# Patient Record
Sex: Male | Born: 1975 | Race: Black or African American | Hispanic: No | Marital: Single | Smoking: Current every day smoker
Health system: Southern US, Community
[De-identification: ages and names within clinical notes are randomized; demographics above are authoritative.]

## PROBLEM LIST (undated history)

## (undated) DIAGNOSIS — K8020 Calculus of gallbladder without cholecystitis without obstruction: Secondary | ICD-10-CM

---

## 1999-03-26 ENCOUNTER — Encounter: Payer: Self-pay | Admitting: Emergency Medicine

## 1999-03-26 ENCOUNTER — Emergency Department (HOSPITAL_COMMUNITY): Admission: EM | Admit: 1999-03-26 | Discharge: 1999-03-26 | Payer: Self-pay | Admitting: Emergency Medicine

## 1999-04-01 ENCOUNTER — Emergency Department (HOSPITAL_COMMUNITY): Admission: EM | Admit: 1999-04-01 | Discharge: 1999-04-01 | Payer: Self-pay | Admitting: Emergency Medicine

## 2005-01-26 ENCOUNTER — Emergency Department (HOSPITAL_COMMUNITY): Admission: EM | Admit: 2005-01-26 | Discharge: 2005-01-26 | Payer: Self-pay | Admitting: Emergency Medicine

## 2006-07-02 IMAGING — CR DG CHEST 1V PORT
1 series · 1 of 1 positions shown · non-contrast
Comparison: None.

CLINICAL DATA: Chest pain.

[view not recorded]
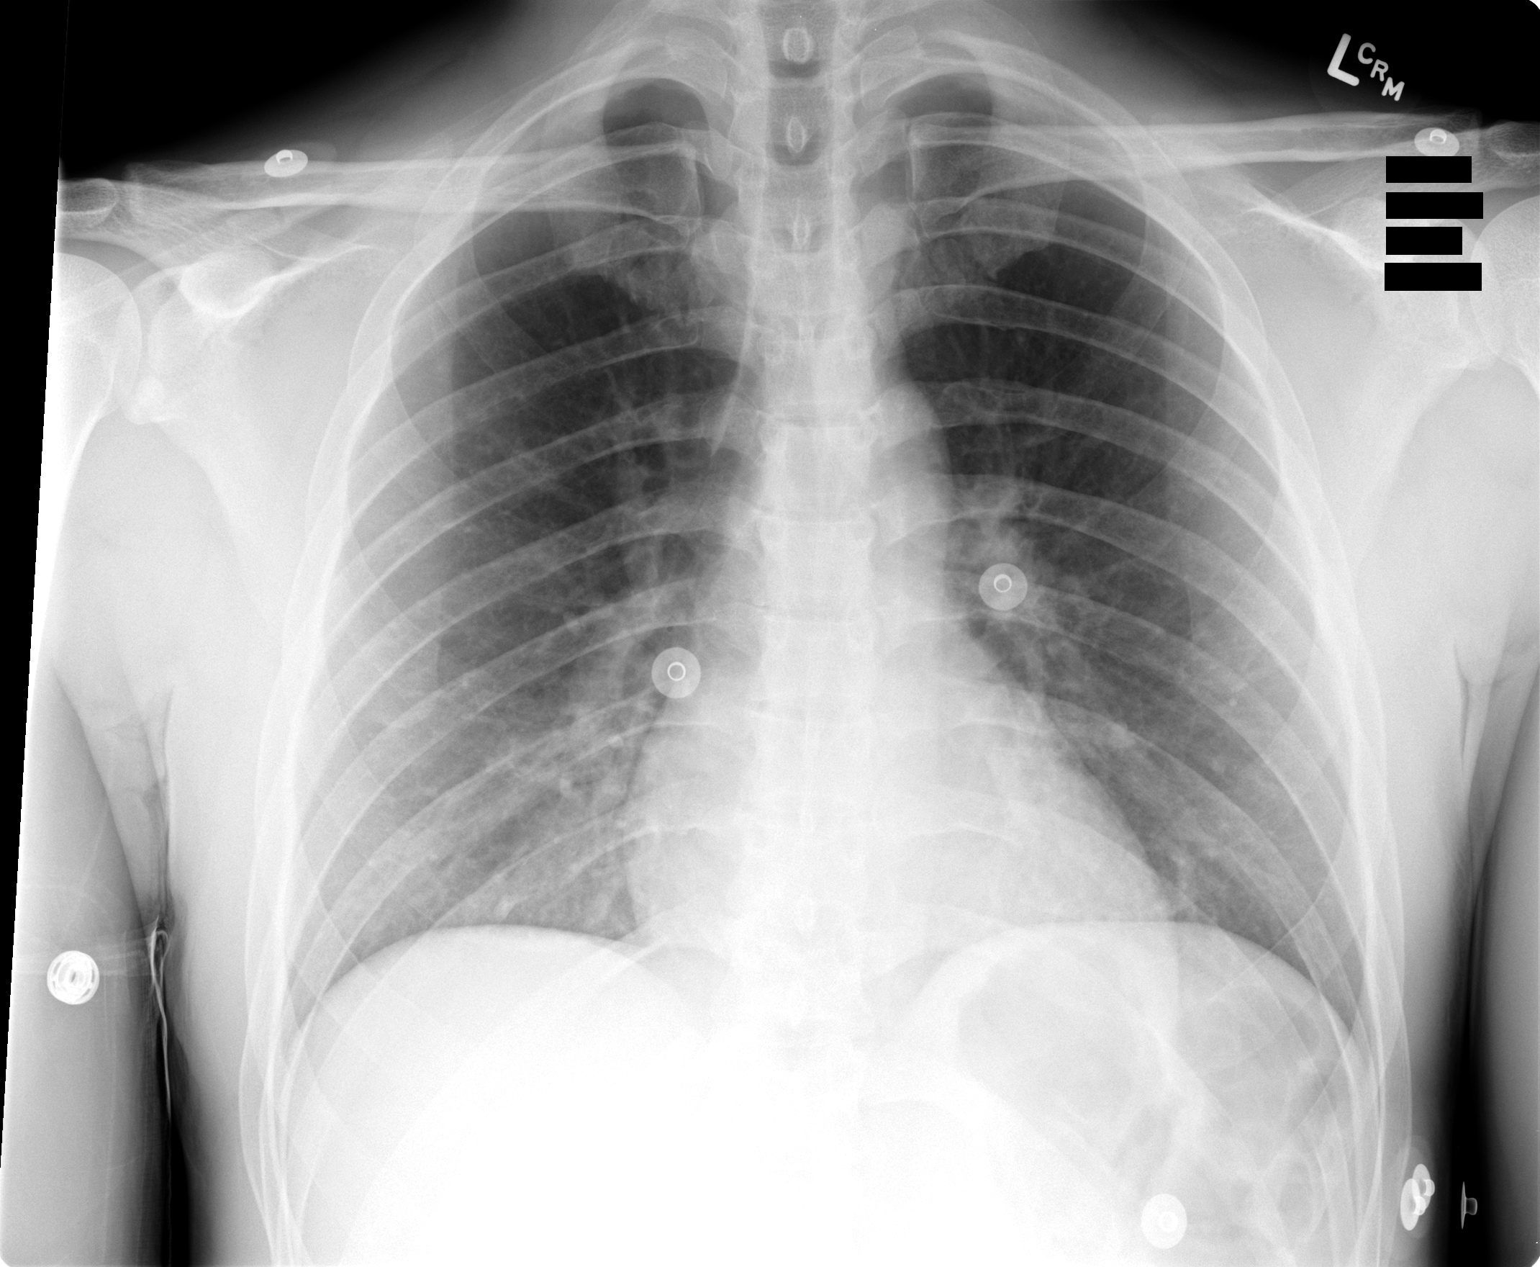

[1 of 1 positions shown; findings below may reference images not displayed]

PORTABLE CHEST - 1 VIEW:
 The heart size and mediastinal contours are within normal limits.  Both lungs are clear.
IMPRESSION: No acute disease.

## 2012-11-03 DIAGNOSIS — Z79899 Other long term (current) drug therapy: Secondary | ICD-10-CM | POA: Insufficient documentation

## 2012-11-03 DIAGNOSIS — K2970 Gastritis, unspecified, without bleeding: Secondary | ICD-10-CM | POA: Insufficient documentation

## 2012-11-03 DIAGNOSIS — R110 Nausea: Secondary | ICD-10-CM | POA: Insufficient documentation

## 2012-11-03 LAB — URINALYSIS, ROUTINE W REFLEX MICROSCOPIC
Bilirubin Urine: NEGATIVE
Glucose, UA: NEGATIVE mg/dL
Hgb urine dipstick: NEGATIVE
Ketones, ur: NEGATIVE mg/dL
Leukocytes, UA: NEGATIVE
Nitrite: NEGATIVE
Protein, ur: NEGATIVE mg/dL
Specific Gravity, Urine: 1.02 (ref 1.005–1.030)
Urobilinogen, UA: 0.2 mg/dL (ref 0.0–1.0)
pH: 6.5 (ref 5.0–8.0)

## 2012-11-03 MED ORDER — ONDANSETRON 4 MG PO TBDP
4.0000 mg | ORAL_TABLET | Freq: Once | ORAL | Status: AC
  Administered 2012-11-03: 4 mg via ORAL
  Filled 2012-11-03: qty 1

## 2012-11-03 NOTE — ED Notes (Signed)
Pt from [facility] arrived via [place] c/o abdominal pain after eating pasta. Pt ambulatory without assistance  EMS VS 142/88, HR 76, Pain 6 on 0 - 10 pain rating scale. No medical Hx, No medications

## 2012-11-04 ENCOUNTER — Encounter (HOSPITAL_COMMUNITY): Payer: Self-pay | Admitting: *Deleted

## 2012-11-04 ENCOUNTER — Emergency Department (HOSPITAL_COMMUNITY)
Admission: EM | Admit: 2012-11-04 | Discharge: 2012-11-04 | Disposition: A | Discharge status: Home / Self Care | Payer: Self-pay | Attending: Emergency Medicine | Admitting: Emergency Medicine

## 2012-11-04 DIAGNOSIS — K2970 Gastritis, unspecified, without bleeding: Secondary | ICD-10-CM

## 2012-11-04 LAB — COMPREHENSIVE METABOLIC PANEL
ALT: 30 U/L (ref 0–53)
AST: 20 U/L (ref 0–37)
Albumin: 4.4 g/dL (ref 3.5–5.2)
Alkaline Phosphatase: 39 U/L (ref 39–117)
BUN: 14 mg/dL (ref 6–23)
CO2: 26 mEq/L (ref 19–32)
Calcium: 9.8 mg/dL (ref 8.4–10.5)
Chloride: 103 mEq/L (ref 96–112)
Creatinine, Ser: 1.08 mg/dL (ref 0.50–1.35)
GFR calc Af Amer: >90 mL/min (ref >90)
GFR calc non Af Amer: 87 mL/min — ABNORMAL LOW (ref >90)
Glucose, Bld: 133 mg/dL — ABNORMAL HIGH (ref 70–99)
Potassium: 4 mEq/L (ref 3.5–5.1)
Sodium: 142 mEq/L (ref 135–145)
Total Bilirubin: 0.4 mg/dL (ref 0.3–1.2)
Total Protein: 7.4 g/dL (ref 6.0–8.3)

## 2012-11-04 LAB — CBC WITH DIFFERENTIAL/PLATELET
Basophils Absolute: 0 10*3/uL (ref 0.0–0.1)
Basophils Relative: 0 % (ref 0–1)
Eosinophils Absolute: 0.1 10*3/uL (ref 0.0–0.7)
Eosinophils Relative: 1 % (ref 0–5)
HCT: 44.7 % (ref 39.0–52.0)
Hemoglobin: 15.5 g/dL (ref 13.0–17.0)
Lymphocytes Relative: 22 % (ref 12–46)
Lymphs Abs: 2.9 10*3/uL (ref 0.7–4.0)
MCH: 31.9 pg (ref 26.0–34.0)
MCHC: 34.7 g/dL (ref 30.0–36.0)
MCV: 92 fL (ref 78.0–100.0)
Monocytes Absolute: 0.6 10*3/uL (ref 0.1–1.0)
Monocytes Relative: 5 % (ref 3–12)
Neutro Abs: 9.5 10*3/uL — ABNORMAL HIGH (ref 1.7–7.7)
Neutrophils Relative %: 73 % (ref 43–77)
Platelets: 255 10*3/uL (ref 150–400)
RBC: 4.86 MIL/uL (ref 4.22–5.81)
RDW: 13.6 % (ref 11.5–15.5)
WBC: 13.1 10*3/uL — ABNORMAL HIGH (ref 4.0–10.5)

## 2012-11-04 LAB — LIPASE, BLOOD: Lipase: 28 U/L (ref 11–59)

## 2012-11-04 MED ORDER — PROMETHAZINE HCL 25 MG PO TABS: 25.0000 mg | ORAL_TABLET | Freq: Four times a day (QID) | ORAL | Status: DC | PRN

## 2012-11-04 MED ORDER — RANITIDINE HCL 150 MG PO CAPS: 150.0000 mg | ORAL_CAPSULE | Freq: Every day | ORAL | Status: DC

## 2012-11-04 MED ORDER — GI COCKTAIL ~~LOC~~
30.0000 mL | Freq: Once | ORAL | Status: AC
  Administered 2012-11-04: 30 mL via ORAL
  Filled 2012-11-04: qty 30

## 2012-11-04 NOTE — ED Provider Notes (Signed)
History     CSN: [id number]  Arrival date & time 11/03/12  2255   First MD Initiated Contact with Patient 11/04/12 [phone]      Chief Complaint  Patient presents with  . Abdominal Pain    (Consider location/radiation/quality/duration/timing/severity/associated sxs/prior treatment) HPI History provided by pt.   Pt has had constant, severe epigastric pain w/ radiation around right side to back since eating dinner (spaghetti) last night.  Non-positional and non-exertional.  Associated w/ nausea.  Denies fever, CP, SOB, diarrhea, hematochezia/melena, and urinary sx.  Drinks alcohol ~2x/wk, most recently yesterday, and usually has 2 40oz beers.  No h/o GERD or gastric ulcers or other PMH.  No h/o abd surgeries.  History reviewed. No pertinent past medical history.  History reviewed. No pertinent past surgical history.  No family history on file.  History  Substance Use Topics  . Smoking status: Never Smoker   . Smokeless tobacco: Not on file  . Alcohol Use: Yes      Review of Systems  All other systems reviewed and are negative.    Allergies  Review of patient's allergies indicates no known allergies.  Home Medications   Current Outpatient Rx  Name  Route  Sig  Dispense  Refill  . promethazine (PHENERGAN) 25 MG tablet   Oral   Take 1 tablet (25 mg total) by mouth every 6 (six) hours as needed for nausea.   20 tablet   0   . ranitidine (ZANTAC) 150 MG capsule   Oral   Take 1 capsule (150 mg total) by mouth daily.   30 capsule   0     BP 127/67  Pulse 66  Temp(Src) 97.9 F (36.6 C) (Oral)  Resp 18  SpO2 99%  Physical Exam  Nursing note and vitals reviewed. Constitutional: He is oriented to person, place, and time. He appears well-developed and well-nourished. No distress.  HENT:  Head: Normocephalic and atraumatic.  Eyes:  Normal appearance  Neck: Normal range of motion.  Cardiovascular: Normal rate, regular rhythm and intact distal pulses.    Pulmonary/Chest: Effort normal and breath sounds normal. No respiratory distress. He exhibits no tenderness.  No pleuritic pain reported  Abdominal: Soft. Bowel sounds are normal. He exhibits no distension. There is no guarding.  Pt guards by flexing abdominal muscles w/ palpation of RUQ but reports that it is non-painful.    Musculoskeletal: Normal range of motion.  No peripheral edema or calf tenderness  Neurological: He is alert and oriented to person, place, and time.  Skin: Skin is warm and dry. No rash noted.  Psychiatric: He has a normal mood and affect. His behavior is normal.    ED Course  Procedures (including critical care time)   Date: 11/04/2012  Rate: 68  Rhythm: normal sinus rhythm  QRS Axis: normal  Intervals: normal  ST/T Wave abnormalities: nonspecific ST changes  Conduction Disutrbances:none  Narrative Interpretation:   Old EKG Reviewed: none available   Labs Reviewed  CBC WITH DIFFERENTIAL - Abnormal; Notable for the following:    WBC 13.1 (*)    Neutro Abs 9.5 (*)    All other components within normal limits  COMPREHENSIVE METABOLIC PANEL - Abnormal; Notable for the following:    Glucose, Bld 133 (*)    GFR calc non Af Amer 87 (*)    All other components within normal limits  LIPASE, BLOOD  URINALYSIS, ROUTINE W REFLEX MICROSCOPIC  POCT I-STAT TROPONIN I   No results found.  1. Gastritis       MDM  36yo healthy M who drinks 2 40oz beers 2x/wk, presents w/ epigastric pain that started after his spaghetti dinner last night.  Associated w/ nausea.  No prior h/o same.  Pain minimal currently.  On exam, afebrile, NAD, abd soft/non-distended and reportedly non-tender.  Labs, including troponin obtained in triage and sig for mild leukocytosis.  EKG shows non-specific ST changes, though patient's sx are not consistent w/ pericarditis.  No prior for comparison.  Suspect gastritis.  Unlikely to be cardiac based on atypical characteristics and lack of RF.   Has not had PP pain/nausea previously to suggest cholelithiasis.  Received a GI cocktail and pain resolved.   D/c'd home w/ promethazine and ranitidine ($4 list) and referred to [facility].  Return precautions discussed.         Otilio Miu, PA-C 11/04/12 [phone]

## 2012-11-04 NOTE — ED Provider Notes (Signed)
Medical screening examination/treatment/procedure(s) were performed by non-physician practitioner and as supervising physician I was immediately available for consultation/collaboration.  Saisha Hogue K Letoya Stallone-Rasch, MD 11/04/12 0627 

## 2012-11-04 NOTE — ED Notes (Signed)
In last 24 hrs. Pt. Ate a lot of spicy foods; and had marijuana yesterday; eating all day.

## 2012-11-24 ENCOUNTER — Encounter (HOSPITAL_COMMUNITY): Payer: Self-pay | Admitting: Emergency Medicine

## 2012-11-24 ENCOUNTER — Emergency Department (HOSPITAL_COMMUNITY): Payer: Self-pay

## 2012-11-24 ENCOUNTER — Emergency Department (HOSPITAL_COMMUNITY)
Admission: EM | Admit: 2012-11-24 | Discharge: 2012-11-24 | Disposition: A | Discharge status: Home / Self Care | Payer: Self-pay | Attending: Emergency Medicine | Admitting: Emergency Medicine

## 2012-11-24 DIAGNOSIS — R110 Nausea: Secondary | ICD-10-CM | POA: Insufficient documentation

## 2012-11-24 DIAGNOSIS — K8050 Calculus of bile duct without cholangitis or cholecystitis without obstruction: Secondary | ICD-10-CM

## 2012-11-24 DIAGNOSIS — Z79899 Other long term (current) drug therapy: Secondary | ICD-10-CM | POA: Insufficient documentation

## 2012-11-24 DIAGNOSIS — K8020 Calculus of gallbladder without cholecystitis without obstruction: Secondary | ICD-10-CM | POA: Insufficient documentation

## 2012-11-24 LAB — CBC WITH DIFFERENTIAL/PLATELET
Basophils Absolute: 0 10*3/uL (ref 0.0–0.1)
Basophils Relative: 0 % (ref 0–1)
Eosinophils Absolute: 0 10*3/uL (ref 0.0–0.7)
Eosinophils Relative: 1 % (ref 0–5)
HCT: 45.2 % (ref 39.0–52.0)
Hemoglobin: 16 g/dL (ref 13.0–17.0)
Lymphocytes Relative: 16 % (ref 12–46)
Lymphs Abs: 1.4 10*3/uL (ref 0.7–4.0)
MCH: 32.4 pg (ref 26.0–34.0)
MCHC: 35.4 g/dL (ref 30.0–36.0)
MCV: 91.5 fL (ref 78.0–100.0)
Monocytes Absolute: 0.5 10*3/uL (ref 0.1–1.0)
Monocytes Relative: 5 % (ref 3–12)
Neutro Abs: 6.8 10*3/uL (ref 1.7–7.7)
Neutrophils Relative %: 78 % — ABNORMAL HIGH (ref 43–77)
Platelets: 274 10*3/uL (ref 150–400)
RBC: 4.94 MIL/uL (ref 4.22–5.81)
RDW: 13.3 % (ref 11.5–15.5)
WBC: 8.7 10*3/uL (ref 4.0–10.5)

## 2012-11-24 LAB — COMPREHENSIVE METABOLIC PANEL
ALT: 32 U/L (ref 0–53)
AST: 24 U/L (ref 0–37)
Albumin: 4.1 g/dL (ref 3.5–5.2)
Alkaline Phosphatase: 46 U/L (ref 39–117)
BUN: 16 mg/dL (ref 6–23)
CO2: 24 mEq/L (ref 19–32)
Calcium: 9.4 mg/dL (ref 8.4–10.5)
Chloride: 98 mEq/L (ref 96–112)
Creatinine, Ser: 1.04 mg/dL (ref 0.50–1.35)
GFR calc Af Amer: >90 mL/min (ref >90)
GFR calc non Af Amer: >90 mL/min (ref >90)
Glucose, Bld: 120 mg/dL — ABNORMAL HIGH (ref 70–99)
Potassium: 3.3 mEq/L — ABNORMAL LOW (ref 3.5–5.1)
Sodium: 135 mEq/L (ref 135–145)
Total Bilirubin: 0.6 mg/dL (ref 0.3–1.2)
Total Protein: 7.3 g/dL (ref 6.0–8.3)

## 2012-11-24 LAB — LIPASE, BLOOD: Lipase: 31 U/L (ref 11–59)

## 2012-11-24 MED ORDER — OXYCODONE-ACETAMINOPHEN 5-325 MG PO TABS: 1.0000 | ORAL_TABLET | ORAL | Status: DC | PRN

## 2012-11-24 MED ORDER — PANTOPRAZOLE SODIUM 40 MG IV SOLR
40.0000 mg | Freq: Once | INTRAVENOUS | Status: AC
  Administered 2012-11-24: 40 mg via INTRAVENOUS
  Filled 2012-11-24: qty 40

## 2012-11-24 NOTE — ED Notes (Signed)
Followed up on lab work. Labs are in progress. Dr. Bebe Shaggy made aware.Pt made aware. Will continue to monitor.

## 2012-11-24 NOTE — ED Provider Notes (Signed)
History     CSN: [id number]  Arrival date & time 11/24/12  7829   First MD Initiated Contact with Patient 11/24/12 0240      Chief Complaint - abdominal pain   Patient is a 37 y.o. male presenting with abdominal pain. The history is provided by the patient.  Abdominal Pain This is a recurrent problem. The current episode started 6 to 12 hours ago. The problem occurs constantly. The problem has been gradually improving. Associated symptoms include abdominal pain. Pertinent negatives include no chest pain and no shortness of breath. The symptoms are aggravated by eating. The symptoms are relieved by rest. He has tried rest (home pain meds) for the symptoms. The treatment provided mild relief.  pt reports soon after eating he developed pain in RUQ.   No cp/sob No back pain No dysuria No fever is reported He reports similar episode last month   PMH - none Soc hx - admits to ETOH use  No past surgical history on file.  No family history on file.  History  Substance Use Topics  . Smoking status: Never Smoker   . Smokeless tobacco: Not on file  . Alcohol Use: Yes      Review of Systems  Constitutional: Negative for fever.  Respiratory: Negative for shortness of breath.   Cardiovascular: Negative for chest pain.  Gastrointestinal: Positive for nausea and abdominal pain.  Genitourinary: Negative for dysuria.  Musculoskeletal: Negative for back pain.  Neurological: Negative for weakness.  All other systems reviewed and are negative.    Allergies  Review of patient's allergies indicates no known allergies.  Home Medications   Current Outpatient Rx  Name  Route  Sig  Dispense  Refill  . promethazine (PHENERGAN) 25 MG tablet   Oral   Take 1 tablet (25 mg total) by mouth every 6 (six) hours as needed for nausea.   20 tablet   0   . ranitidine (ZANTAC) 150 MG capsule   Oral   Take 1 capsule (150 mg total) by mouth daily.   30 capsule   0     BP 127/87  Pulse 62   Temp(Src) 98.4 F (36.9 C) (Oral)  Ht 5\' 7"  (1.702 m)  Wt 180 lb (81.647 kg)  BMI 28.19 kg/m2  SpO2 97%  Physical Exam CONSTITUTIONAL: Well developed/well nourished HEAD: Normocephalic/atraumatic EYES: EOMI/PERRL, no icterus ENMT: Mucous membranes moist NECK: supple no meningeal signs SPINE:entire spine nontender CV: S1/S2 noted, no murmurs/rubs/gallops noted LUNGS: Lungs are clear to auscultation bilaterally, no apparent distress ABDOMEN: soft, RUQ tenderness is noted, no rebound or guarding GU:no cva tenderness NEURO: Pt is awake/alert, moves all extremitiesx4 EXTREMITIES: pulses normal, full ROM SKIN: warm, color normal PSYCH: no abnormalities of mood noted  ED Course  Procedures  3:16 AM Pt here for recurrent RUQ abdominal pain that occurs after eating He has pain, but is improving He requests imaging - will order RUQ ultrasound He had labs back in may that were unremarkable.  Will defer labs for now unless imaging is positive He is in no distress 6:17 AM Labs ordered when cholelithiasis noted Labs reassuring Advised pt of his diagnosis and need for outpatient followup He appears well, sleeping in the room at this time Strict return precautions discussed with patient  MDM  Nursing notes including past medical history and social history reviewed and considered in documentation Previous records reviewed and considered - previous labs reviewed from ED visit on -05/17 Labs/vital reviewed and considered Ultrasound reviewed and  considered        Date: 11/24/2012  Rate: 71  Rhythm: normal sinus rhythm  QRS Axis: normal  Intervals: normal  ST/T Wave abnormalities: early repolarization  Conduction Disutrbances:none  Narrative Interpretation:   Old EKG Reviewed: unchanged from prior    Joya Gaskins, MD 11/24/12 [phone]

## 2012-11-25 ENCOUNTER — Emergency Department (HOSPITAL_COMMUNITY)
Admission: EM | Admit: 2012-11-25 | Discharge: 2012-11-25 | Disposition: A | Discharge status: Home / Self Care | Payer: Self-pay | Attending: Emergency Medicine | Admitting: Emergency Medicine

## 2012-11-25 ENCOUNTER — Encounter (HOSPITAL_COMMUNITY): Payer: Self-pay | Admitting: *Deleted

## 2012-11-25 DIAGNOSIS — K8050 Calculus of bile duct without cholangitis or cholecystitis without obstruction: Secondary | ICD-10-CM

## 2012-11-25 DIAGNOSIS — K8020 Calculus of gallbladder without cholecystitis without obstruction: Secondary | ICD-10-CM | POA: Insufficient documentation

## 2012-11-25 LAB — COMPREHENSIVE METABOLIC PANEL
Alkaline Phosphatase: 43 U/L (ref 39–117)
BUN: 12 mg/dL (ref 6–23)
CO2: 25 mEq/L (ref 19–32)
GFR calc Af Amer: >90 mL/min (ref >90)
GFR calc non Af Amer: >90 mL/min (ref >90)
Glucose, Bld: 105 mg/dL — ABNORMAL HIGH (ref 70–99)
Potassium: 3.7 mEq/L (ref 3.5–5.1)
Total Protein: 6.8 g/dL (ref 6.0–8.3)

## 2012-11-25 LAB — CBC WITH DIFFERENTIAL/PLATELET
Eosinophils Absolute: 0.1 10*3/uL (ref 0.0–0.7)
Eosinophils Relative: 2 % (ref 0–5)
Hemoglobin: 15 g/dL (ref 13.0–17.0)
Lymphocytes Relative: 24 % (ref 12–46)
Lymphs Abs: 1.8 10*3/uL (ref 0.7–4.0)
MCH: 32.3 pg (ref 26.0–34.0)
MCV: 92.3 fL (ref 78.0–100.0)
Monocytes Relative: 8 % (ref 3–12)
Neutrophils Relative %: 67 % (ref 43–77)
RBC: 4.65 MIL/uL (ref 4.22–5.81)

## 2012-11-25 LAB — LIPASE, BLOOD: Lipase: 35 U/L (ref 11–59)

## 2012-11-25 MED ORDER — HYDROMORPHONE HCL PF 2 MG/ML IJ SOLN
2.0000 mg | Freq: Once | INTRAMUSCULAR | Status: AC
  Administered 2012-11-25: 2 mg via INTRAMUSCULAR
  Filled 2012-11-25: qty 1

## 2012-11-25 MED ORDER — HYDROCODONE-ACETAMINOPHEN 5-325 MG PO TABS: 2.0000 | ORAL_TABLET | ORAL | Status: DC | PRN

## 2012-11-25 NOTE — ED Provider Notes (Signed)
History     CSN: [id number]  Arrival date & time 11/25/12  0128   First MD Initiated Contact with Patient 11/25/12 0204      No chief complaint on file.   (Consider location/radiation/quality/duration/timing/severity/associated sxs/prior treatment) HPI Comments: Patient presents with complaints of right upper quadrant pain that started up again at 10PM.  He was seen for yesterday and had an ultrasound that showed cholelithiasis without evidence for cholecystitis.  He was feeling better and discharged.  His pain returned tonight.  No vomiting or diarrhea.  No fevers.  Patient is a 37 y.o. male presenting with abdominal pain. The history is provided by the patient.  Abdominal Pain This is a new problem. Episode onset: 10 PM. The problem occurs constantly. The problem has not changed since onset.Associated symptoms include abdominal pain. Nothing aggravates the symptoms. Nothing relieves the symptoms. He has tried nothing for the symptoms. The treatment provided no relief.    No past medical history on file.  No past surgical history on file.  No family history on file.  History  Substance Use Topics  . Smoking status: Never Smoker   . Smokeless tobacco: Not on file  . Alcohol Use: Yes      Review of Systems  Gastrointestinal: Positive for abdominal pain.  All other systems reviewed and are negative.    Allergies  Review of patient's allergies indicates no known allergies.  Home Medications   Current Outpatient Rx  Name  Route  Sig  Dispense  Refill  . oxyCODONE-acetaminophen (PERCOCET/ROXICET) 5-325 MG per tablet   Oral   Take 1 tablet by mouth every 4 (four) hours as needed for pain.   6 tablet   0     BP 146/91  Pulse 67  Temp(Src) 98.6 F (37 C) (Oral)  Resp 16  SpO2 96%  Physical Exam  Nursing note and vitals reviewed. Constitutional: He is oriented to person, place, and time. He appears well-developed and well-nourished. No distress.  HENT:  Head:  Normocephalic and atraumatic.  Mouth/Throat: Oropharynx is clear and moist.  Neck: Normal range of motion. Neck supple.  Cardiovascular: Normal rate and regular rhythm.   No murmur heard. Pulmonary/Chest: Effort normal and breath sounds normal. No respiratory distress.  Abdominal: Soft. Bowel sounds are normal. He exhibits no distension. There is tenderness.  There is ttp in the right upper quadrant with no rebound or guarding.   Musculoskeletal: Normal range of motion. He exhibits no edema.  Neurological: He is alert and oriented to person, place, and time.  Skin: Skin is warm and dry. He is not diaphoretic.    ED Course  Procedures (including critical care time)  Labs Reviewed  CBC WITH DIFFERENTIAL  COMPREHENSIVE METABOLIC PANEL  LIPASE, BLOOD   US Abdomen Complete  11/24/2012   *RADIOLOGY REPORT*  Clinical Data:  Right upper quadrant abdominal pain.  ABDOMINAL ULTRASOUND COMPLETE  Comparison:  None  Findings:  Gallbladder:  At least two stones are visualized within the gallbladder, measuring 1.0 cm and 0.8 cm in size.  These are seen at the gallbladder fundus.  The gallbladder wall remains normal in thickness; no pericholecystic fluid is seen.  No ultrasonographic Murphy's sign is elicited.  Common Bile Duct:  0.4 cm in diameter; within normal limits in caliber.  Liver:  Normal parenchymal echogenicity and echotexture; a small 1.7 x 1.5 x 1.0 cm mildly hyperechoic focus within the right hepatic lobe is most compatible with a hemangioma.  Limited Doppler evaluation demonstrates normal  blood flow within the liver.  IVC:  Unremarkable in appearance.  Pancreas:  Although the pancreas is difficult to visualize in its entirety due to overlying bowel gas, no focal pancreatic abnormality is identified.  Spleen:  7.8 cm in length; within normal limits in size and echotexture.  Right kidney:  10.9 cm in length; normal in size, configuration and parenchymal echogenicity.  No evidence of mass or  hydronephrosis.  Left kidney:  10.2 cm in length; normal in size, configuration and parenchymal echogenicity.  No evidence of mass or hydronephrosis.  Abdominal Aorta:  Normal in caliber; no aneurysm identified.  IMPRESSION:  1.  Cholelithiasis; gallbladder otherwise unremarkable in appearance.  No evidence for obstruction or cholecystitis. 2.  Small hepatic hemangioma noted.   Original Report Authenticated By: Tonia Ghent, M.D.     No diagnosis found.    MDM  There is no leukocytosis and lfts remain normal.  Will prescribe pain medication for him to take until he can follow up with general surgery to discuss his gallstones.  I have also advised him to watch his diet as he tells me he ate fried chicken, chicken wings, potato chips, and other fried foods for dinner prior to his pain returning.        Geoffery Lyons, MD 11/25/12 [phone]

## 2012-11-25 NOTE — ED Notes (Signed)
Patient arrived via EMS with c/o RUQ pain.  Seen here last night for the same thing.  Felt better when he went home. Tonight about 10pm he ate 3 chicken wings with hot sauce, baked potato with butter, cup of noodles, potato chips.  After eating this he started hurting in the RUQ just like last night.

## 2012-11-27 ENCOUNTER — Emergency Department (HOSPITAL_COMMUNITY)
Admission: EM | Admit: 2012-11-27 | Discharge: 2012-11-27 | Disposition: A | Discharge status: Home / Self Care | Payer: Self-pay | Attending: Emergency Medicine | Admitting: Emergency Medicine

## 2012-11-27 ENCOUNTER — Encounter (HOSPITAL_COMMUNITY): Payer: Self-pay | Admitting: Emergency Medicine

## 2012-11-27 DIAGNOSIS — K8050 Calculus of bile duct without cholangitis or cholecystitis without obstruction: Secondary | ICD-10-CM

## 2012-11-27 DIAGNOSIS — R109 Unspecified abdominal pain: Secondary | ICD-10-CM | POA: Insufficient documentation

## 2012-11-27 DIAGNOSIS — K8020 Calculus of gallbladder without cholecystitis without obstruction: Secondary | ICD-10-CM | POA: Insufficient documentation

## 2012-11-27 LAB — CBC WITH DIFFERENTIAL/PLATELET
Basophils Absolute: 0 10*3/uL (ref 0.0–0.1)
Basophils Relative: 0 % (ref 0–1)
Eosinophils Absolute: 0.1 10*3/uL (ref 0.0–0.7)
Hemoglobin: 14.8 g/dL (ref 13.0–17.0)
MCH: 31.5 pg (ref 26.0–34.0)
MCHC: 34.3 g/dL (ref 30.0–36.0)
Monocytes Relative: 8 % (ref 3–12)
Neutro Abs: 4.5 10*3/uL (ref 1.7–7.7)
Neutrophils Relative %: 61 % (ref 43–77)
Platelets: 266 10*3/uL (ref 150–400)
RDW: 13.1 % (ref 11.5–15.5)

## 2012-11-27 LAB — COMPREHENSIVE METABOLIC PANEL
AST: 22 U/L (ref 0–37)
Albumin: 3.8 g/dL (ref 3.5–5.2)
Alkaline Phosphatase: 42 U/L (ref 39–117)
Chloride: 103 mEq/L (ref 96–112)
Potassium: 3.6 mEq/L (ref 3.5–5.1)
Sodium: 136 mEq/L (ref 135–145)
Total Bilirubin: 0.4 mg/dL (ref 0.3–1.2)
Total Protein: 7.2 g/dL (ref 6.0–8.3)

## 2012-11-27 LAB — LIPASE, BLOOD: Lipase: 29 U/L (ref 11–59)

## 2012-11-27 MED ORDER — HYDROCODONE-ACETAMINOPHEN 5-325 MG PO TABS
2.0000 | ORAL_TABLET | Freq: Once | ORAL | Status: AC
  Administered 2012-11-27: 2 via ORAL
  Filled 2012-11-27: qty 2

## 2012-11-27 MED ORDER — HYDROCODONE-ACETAMINOPHEN 5-325 MG PO TABS: 1.0000 | ORAL_TABLET | Freq: Four times a day (QID) | ORAL | Status: DC | PRN

## 2012-11-27 MED ORDER — ONDANSETRON 8 MG PO TBDP
8.0000 mg | ORAL_TABLET | Freq: Once | ORAL | Status: AC
  Administered 2012-11-27: 8 mg via ORAL
  Filled 2012-11-27: qty 1

## 2012-11-27 NOTE — ED Notes (Signed)
Pt reports that he has not taken any pain medication since his ED discharge at [facility] last Saturday.

## 2012-11-27 NOTE — ED Provider Notes (Signed)
History     CSN: [id number]  Arrival date & time 11/27/12  0244   First MD Initiated Contact with Patient 11/27/12 0330      Chief Complaint  Patient presents with  . Abdominal Pain    (Consider location/radiation/quality/duration/timing/severity/associated sxs/prior treatment) Patient is a 37 y.o. male presenting with abdominal pain. The history is provided by the patient.  Abdominal Pain Associated symptoms include abdominal pain. Pertinent negatives include no chest pain, no headaches and no shortness of breath.  pt c/o upper abdominal pain, esp right side, tonight. Pain moderate, dull, non radiating. States has had same pain intermittently in past few days. Dx w gallstones on u/s 2 days ago. No hx pud or pancreatitis. occ etoh use, including tonight. States alcohol and certain foods make pain worse. No back or flank pain. No fever or chills. No nv. Having normal bms incl today. No gu c/o. No prior abd surgery.      History reviewed. No pertinent past medical history.  History reviewed. No pertinent past surgical history.  History reviewed. No pertinent family history.  History  Substance Use Topics  . Smoking status: Never Smoker   . Smokeless tobacco: Not on file  . Alcohol Use: Yes      Review of Systems  Constitutional: Negative for fever.  HENT: Negative for neck pain.   Eyes: Negative for redness.  Respiratory: Negative for cough and shortness of breath.   Cardiovascular: Negative for chest pain.  Gastrointestinal: Positive for abdominal pain.  Genitourinary: Negative for flank pain.  Musculoskeletal: Negative for back pain.  Skin: Negative for rash.  Neurological: Negative for headaches.  Hematological: Does not bruise/bleed easily.  Psychiatric/Behavioral: Negative for confusion.    Allergies  Review of patient's allergies indicates no known allergies.  Home Medications  No current outpatient prescriptions on file.  BP 156/76  Pulse 72   Temp(Src) 97.9 F (36.6 C) (Oral)  Resp 18  Ht 5\' 7"  (1.702 m)  Wt 185 lb (83.915 kg)  BMI 28.97 kg/m2  SpO2 96%  Physical Exam  Nursing note and vitals reviewed. Constitutional: He is oriented to person, place, and time. He appears well-developed and well-nourished. No distress.  HENT:  Head: Atraumatic.  Eyes: Conjunctivae are normal. No scleral icterus.  Neck: Neck supple. No tracheal deviation present.  Cardiovascular: Normal rate, regular rhythm, normal heart sounds and intact distal pulses.   Pulmonary/Chest: Effort normal and breath sounds normal. No accessory muscle usage. No respiratory distress.  Abdominal: Soft. Bowel sounds are normal. He exhibits no distension and no mass. There is no tenderness. There is no rebound and no guarding.  Genitourinary:  No cva tenderness  Musculoskeletal: Normal range of motion. He exhibits no edema and no tenderness.  Neurological: He is alert and oriented to person, place, and time.  Skin: Skin is warm and dry.  Psychiatric: He has a normal mood and affect.    ED Course  Procedures (including critical care time)     MDM  Labs.  Reviewed nursing notes and prior charts for additional history.   Recent u/s c/w gallstones.   vicodin po. zofran po.          Suzi Roots, MD 11/27/12 [phone]

## 2012-11-27 NOTE — ED Notes (Signed)
JXB:[id number]<GN> Expected date:11/27/12<BR> Expected time: 2:31 AM<BR> Means of arrival:Ambulance<BR> Comments:<BR> abd pain

## 2012-11-27 NOTE — ED Notes (Addendum)
Brought in by EMS from home with c/o abdominal pain.  Per EMS, pt has had abdominal pain and was seen at [facility] last Saturday-- pt was diagnosed with "Biliary Colic"; pt reported to EMS that he had been "drinking" tonight; pt denies nausea or vomiting.  Pt presents to ED ambulatory and in no s/s apparent distress.

## 2012-11-29 ENCOUNTER — Emergency Department (HOSPITAL_COMMUNITY)
Admission: EM | Admit: 2012-11-29 | Discharge: 2012-11-30 | Disposition: A | Discharge status: Home / Self Care | Payer: Self-pay | Attending: Emergency Medicine | Admitting: Emergency Medicine

## 2012-11-29 ENCOUNTER — Encounter (HOSPITAL_COMMUNITY): Payer: Self-pay | Admitting: *Deleted

## 2012-11-29 DIAGNOSIS — Z8719 Personal history of other diseases of the digestive system: Secondary | ICD-10-CM | POA: Insufficient documentation

## 2012-11-29 DIAGNOSIS — R1011 Right upper quadrant pain: Secondary | ICD-10-CM | POA: Insufficient documentation

## 2012-11-29 MED ORDER — HYDROCODONE-ACETAMINOPHEN 5-325 MG PO TABS
1.0000 | ORAL_TABLET | Freq: Once | ORAL | Status: AC
  Administered 2012-11-30: 1 via ORAL
  Filled 2012-11-29: qty 1

## 2012-11-29 MED ORDER — ONDANSETRON 4 MG PO TBDP
4.0000 mg | ORAL_TABLET | Freq: Once | ORAL | Status: AC
Start: 2012-11-30 — End: 2012-11-30
  Administered 2012-11-30: 4 mg via ORAL
  Filled 2012-11-29: qty 1

## 2012-11-29 NOTE — ED Notes (Signed)
HQI:[id number]<XB> Expected date:<BR> Expected time:<BR> Means of arrival:<BR> Comments:<BR> EMS, abd pain

## 2012-11-29 NOTE — ED Notes (Signed)
Pt c/o abd pain x 4 weeks. Pt has been seen in Ed several times per ems. Pt walked to bed from ambulance.

## 2012-11-29 NOTE — ED Provider Notes (Signed)
History     CSN: [id number]  Arrival date & time 11/29/12  2339   First MD Initiated Contact with Patient 11/29/12 2347      Chief Complaint  Patient presents with  . Abdominal Pain    (Consider location/radiation/quality/duration/timing/severity/associated sxs/prior treatment) Patient is a 37 y.o. male presenting with abdominal pain. The history is provided by the patient.  Abdominal Pain This is a new problem. The current episode started 1 to 2 hours ago. The problem occurs constantly. The problem has not changed since onset.Associated symptoms include abdominal pain. Pertinent negatives include no chest pain, no headaches and no shortness of breath. The symptoms are aggravated by eating. Nothing relieves the symptoms. He has tried nothing for the symptoms. The treatment provided no relief.  has known gallstones, diagnosed about 2 weeks, has been trying to avoid fatty foods, but tonight ate pizza which he feels caused this "gallbladder attack".  He has been referred to [facility] but has not seen anyone in the clinic. Pain located RUQ, radiates to his back, sharp in quality, mod to severe. No F/C. No N/V/D. No hematuria. Feels like previous bouts of pain tha the has been seen here for in the past.    History reviewed. No pertinent past medical history.  History reviewed. No pertinent past surgical history.  History reviewed. No pertinent family history.  History  Substance Use Topics  . Smoking status: Never Smoker   . Smokeless tobacco: Not on file  . Alcohol Use: Yes      Review of Systems  Constitutional: Negative for fever and chills.  HENT: Negative for neck pain and neck stiffness.   Eyes: Negative for pain.  Respiratory: Negative for shortness of breath.   Cardiovascular: Negative for chest pain.  Gastrointestinal: Positive for abdominal pain. Negative for vomiting and blood in stool.  Genitourinary: Negative for dysuria.  Musculoskeletal: Negative for back pain.  Skin:  Negative for rash.  Neurological: Negative for headaches.  All other systems reviewed and are negative.    Allergies  Review of patient's allergies indicates no known allergies.  Home Medications   Current Outpatient Rx  Name  Route  Sig  Dispense  Refill  . aspirin 325 MG tablet   Oral   Take 325 mg by mouth every 6 (six) hours as needed for pain.         Marland Kitchen HYDROcodone-acetaminophen (NORCO/VICODIN) 5-325 MG per tablet   Oral   Take 1-2 tablets by mouth every 6 (six) hours as needed for pain.   20 tablet   0     BP 144/87  Pulse 60  Temp(Src) 98.2 F (36.8 C) (Oral)  Resp 18  SpO2 99%  Physical Exam  Constitutional: He is oriented to person, place, and time. He appears well-developed and well-nourished.  HENT:  Head: Normocephalic and atraumatic.  Eyes: EOM are normal. Pupils are equal, round, and reactive to light.  Neck: Neck supple.  Cardiovascular: Regular rhythm and intact distal pulses.   Pulmonary/Chest: Effort normal. No respiratory distress.  Abdominal: Soft. Bowel sounds are normal. He exhibits no distension.  TTP RUQ positive murphys sign  Musculoskeletal: Normal range of motion. He exhibits no edema.  Neurological: He is alert and oriented to person, place, and time.  Skin: Skin is warm and dry.    ED Course  Procedures (including critical care time)  Results for orders placed during the hospital encounter of 11/29/12  CBC      Result Value Range   WBC  6.7  4.0 - 10.5 K/uL   RBC 4.81  4.22 - 5.81 MIL/uL   Hemoglobin 14.8  13.0 - 17.0 g/dL   HCT 16.1  09.6 - 04.5 %   MCV 91.3  78.0 - 100.0 fL   MCH 30.8  26.0 - 34.0 pg   MCHC 33.7  30.0 - 36.0 g/dL   RDW 40.9  81.1 - 91.4 %   Platelets 268  150 - 400 K/uL  COMPREHENSIVE METABOLIC PANEL      Result Value Range   Sodium 136  135 - 145 mEq/L   Potassium 3.7  3.5 - 5.1 mEq/L   Chloride 103  96 - 112 mEq/L   CO2 21  19 - 32 mEq/L   Glucose, Bld 126 (*) 70 - 99 mg/dL   BUN 12  6 - 23 mg/dL    Creatinine, Ser 7.82  0.50 - 1.35 mg/dL   Calcium 9.4  8.4 - 95.6 mg/dL   Total Protein 7.3  6.0 - 8.3 g/dL   Albumin 3.8  3.5 - 5.2 g/dL   AST 23  0 - 37 U/L   ALT 45  0 - 53 U/L   Alkaline Phosphatase 44  39 - 117 U/L   Total Bilirubin 0.4  0.3 - 1.2 mg/dL   GFR calc non Af Amer >90  >90 mL/min   GFR calc Af Amer >90  >90 mL/min  LIPASE, BLOOD      Result Value Range   Lipase 29  11 - 59 U/L   US Abdomen Complete  11/24/2012   *RADIOLOGY REPORT*  Clinical Data:  Right upper quadrant abdominal pain.  ABDOMINAL ULTRASOUND COMPLETE  Comparison:  None  Findings:  Gallbladder:  At least two stones are visualized within the gallbladder, measuring 1.0 cm and 0.8 cm in size.  These are seen at the gallbladder fundus.  The gallbladder wall remains normal in thickness; no pericholecystic fluid is seen.  No ultrasonographic Murphy's sign is elicited.  Common Bile Duct:  0.4 cm in diameter; within normal limits in caliber.  Liver:  Normal parenchymal echogenicity and echotexture; a small 1.7 x 1.5 x 1.0 cm mildly hyperechoic focus within the right hepatic lobe is most compatible with a hemangioma.  Limited Doppler evaluation demonstrates normal blood flow within the liver.  IVC:  Unremarkable in appearance.  Pancreas:  Although the pancreas is difficult to visualize in its entirety due to overlying bowel gas, no focal pancreatic abnormality is identified.  Spleen:  7.8 cm in length; within normal limits in size and echotexture.  Right kidney:  10.9 cm in length; normal in size, configuration and parenchymal echogenicity.  No evidence of mass or hydronephrosis.  Left kidney:  10.2 cm in length; normal in size, configuration and parenchymal echogenicity.  No evidence of mass or hydronephrosis.  Abdominal Aorta:  Normal in caliber; no aneurysm identified.  IMPRESSION:  1.  Cholelithiasis; gallbladder otherwise unremarkable in appearance.  No evidence for obstruction or cholecystitis. 2.  Small hepatic hemangioma  noted.   Original Report Authenticated By: Tonia Ghent, M.D.   PO norco and zofran provided  2:09 AM recheck  - pain resolved. ABD exam improved, no acute ABD, minimal tenderness. No leukocytosis.  Normal LFTs.  PT comfortable with plan d/c home.  He has not yet filled his RX for hydrocodone/ agrees to do so.  He agree to diet precautions and f/u [facility]. Return precautions provided   MDM  RUQ pain with recent h/o gallstones  Labs reviewed as above  Previous ER visits and records reviewed - gallstones on [place] 11-24-12  Improved with medications/ stable for discharge/ no indication for emergent [facility] consult at this time  VS and nursing notes reviewed          Sunnie Nielsen, MD 11/30/12 [phone]

## 2012-11-30 LAB — COMPREHENSIVE METABOLIC PANEL
ALT: 45 U/L (ref 0–53)
AST: 23 U/L (ref 0–37)
Albumin: 3.8 g/dL (ref 3.5–5.2)
Calcium: 9.4 mg/dL (ref 8.4–10.5)
Creatinine, Ser: 0.98 mg/dL (ref 0.50–1.35)
Sodium: 136 mEq/L (ref 135–145)
Total Protein: 7.3 g/dL (ref 6.0–8.3)

## 2012-11-30 LAB — CBC
MCH: 30.8 pg (ref 26.0–34.0)
MCHC: 33.7 g/dL (ref 30.0–36.0)
MCV: 91.3 fL (ref 78.0–100.0)
Platelets: 268 10*3/uL (ref 150–400)
RBC: 4.81 MIL/uL (ref 4.22–5.81)
RDW: 12.9 % (ref 11.5–15.5)

## 2012-12-11 ENCOUNTER — Encounter (HOSPITAL_COMMUNITY): Payer: Self-pay

## 2012-12-11 ENCOUNTER — Emergency Department (HOSPITAL_COMMUNITY)
Admission: EM | Admit: 2012-12-11 | Discharge: 2012-12-11 | Disposition: A | Discharge status: Home / Self Care | Payer: Self-pay | Attending: Emergency Medicine | Admitting: Emergency Medicine

## 2012-12-11 DIAGNOSIS — K8050 Calculus of bile duct without cholangitis or cholecystitis without obstruction: Secondary | ICD-10-CM

## 2012-12-11 DIAGNOSIS — R110 Nausea: Secondary | ICD-10-CM | POA: Insufficient documentation

## 2012-12-11 DIAGNOSIS — K8020 Calculus of gallbladder without cholecystitis without obstruction: Secondary | ICD-10-CM | POA: Insufficient documentation

## 2012-12-11 HISTORY — DX: Calculus of gallbladder without cholecystitis without obstruction: K80.20

## 2012-12-11 LAB — COMPREHENSIVE METABOLIC PANEL
ALT: 36 U/L (ref 0–53)
Alkaline Phosphatase: 53 U/L (ref 39–117)
BUN: 10 mg/dL (ref 6–23)
CO2: 24 mEq/L (ref 19–32)
GFR calc Af Amer: >90 mL/min (ref >90)
GFR calc non Af Amer: >90 mL/min (ref >90)
Glucose, Bld: 108 mg/dL — ABNORMAL HIGH (ref 70–99)
Potassium: 3.8 mEq/L (ref 3.5–5.1)
Sodium: 138 mEq/L (ref 135–145)
Total Protein: 7 g/dL (ref 6.0–8.3)

## 2012-12-11 LAB — LIPASE, BLOOD: Lipase: 20 U/L (ref 11–59)

## 2012-12-11 LAB — CBC WITH DIFFERENTIAL/PLATELET
Eosinophils Absolute: 0 10*3/uL (ref 0.0–0.7)
Eosinophils Relative: 0 % (ref 0–5)
Hemoglobin: 14.5 g/dL (ref 13.0–17.0)
Lymphocytes Relative: 10 % — ABNORMAL LOW (ref 12–46)
Lymphs Abs: 1.1 10*3/uL (ref 0.7–4.0)
MCH: 31.8 pg (ref 26.0–34.0)
MCV: 91.2 fL (ref 78.0–100.0)
Monocytes Relative: 5 % (ref 3–12)
Platelets: 237 10*3/uL (ref 150–400)
RBC: 4.56 MIL/uL (ref 4.22–5.81)
WBC: 10.4 10*3/uL (ref 4.0–10.5)

## 2012-12-11 MED ORDER — HYDROCODONE-ACETAMINOPHEN 5-325 MG PO TABS: 1.0000 | ORAL_TABLET | Freq: Four times a day (QID) | ORAL | Status: DC | PRN

## 2012-12-11 MED ORDER — ONDANSETRON 8 MG PO TBDP: 8.0000 mg | ORAL_TABLET | Freq: Three times a day (TID) | ORAL | Status: DC | PRN

## 2012-12-11 MED ORDER — HYOSCYAMINE SULFATE 0.125 MG PO TABS
0.1250 mg | ORAL_TABLET | ORAL | Status: DC | PRN
  Filled 2012-12-11 (×2): qty 1

## 2012-12-11 MED ORDER — ONDANSETRON 8 MG PO TBDP
8.0000 mg | ORAL_TABLET | Freq: Once | ORAL | Status: AC
  Administered 2012-12-11: 8 mg via ORAL
  Filled 2012-12-11: qty 1

## 2012-12-11 MED ORDER — HYOSCYAMINE SULFATE 0.125 MG PO TABS: 0.1250 mg | ORAL_TABLET | ORAL | Status: DC | PRN

## 2012-12-11 NOTE — ED Provider Notes (Signed)
History    CSN: [id number] Arrival date & time 12/11/12  0413  First MD Initiated Contact with Patient 12/11/12 [phone]     Chief Complaint  Patient presents with  . Abdominal Pain   (Consider location/radiation/quality/duration/timing/severity/associated sxs/prior Treatment) HPI 37 year old male presents to emergency room to have his gallstone removed.  Patient reports he has been told that he has a gallstone, and he does not have the money to have the surgery.  Patient thinks that if he comes to the emergency department enough, the surgeons will take it out.  Patient denies any fever or chills.  He had some nausea earlier, but no vomiting.  Patient reports when he takes the pain medicine, the biliary colic is better.  He reports that he usually has some degree of low level pain, so he decided to have some spicy hot dogs last night.  This worsened the pain.  He has not yet followed up with the surgery clinic as advised on his prior 3 visits.  Past Medical History  Diagnosis Date  . Gallstones    History reviewed. No pertinent past surgical history. History reviewed. No pertinent family history. History  Substance Use Topics  . Smoking status: Never Smoker   . Smokeless tobacco: Not on file  . Alcohol Use: Yes    Review of Systems  All other systems reviewed and are negative.    Allergies  Review of patient's allergies indicates no known allergies.  Home Medications   Current Outpatient Rx  Name  Route  Sig  Dispense  Refill  . HYDROcodone-acetaminophen (NORCO/VICODIN) 5-325 MG per tablet   Oral   Take 1-2 tablets by mouth every 6 (six) hours as needed for pain.   20 tablet   0   . hyoscyamine (LEVSIN, ANASPAZ) 0.125 MG tablet   Oral   Take 1 tablet (0.125 mg total) by mouth every 4 (four) hours as needed for cramping.   30 tablet   0   . ondansetron (ZOFRAN-ODT) 8 MG disintegrating tablet   Oral   Take 1 tablet (8 mg total) by mouth every 8 (eight) hours as  needed for nausea.   20 tablet   0    BP 135/73  Pulse 63  Temp(Src) 98.1 F (36.7 C) (Oral)  Resp 16  SpO2 98% Physical Exam  Nursing note and vitals reviewed. Constitutional: He appears well-developed and well-nourished.  HENT:  Head: Normocephalic and atraumatic.  Right Ear: External ear normal.  Left Ear: External ear normal.  Nose: Nose normal.  Mouth/Throat: Oropharynx is clear and moist.  Eyes: Conjunctivae and EOM are normal. Pupils are equal, round, and reactive to light.  Neck: Normal range of motion. Neck supple. No JVD present. No tracheal deviation present. No thyromegaly present.  Cardiovascular: Normal rate, regular rhythm, normal heart sounds and intact distal pulses.  Exam reveals no gallop and no friction rub.   No murmur heard. Pulmonary/Chest: Effort normal and breath sounds normal. No stridor. No respiratory distress. He has no wheezes. He has no rales. He exhibits no tenderness.  Abdominal: Soft. Bowel sounds are normal. He exhibits no distension and no mass. There is tenderness (mild right upper quadrant tenderness). There is no rebound and no guarding.  Musculoskeletal: Normal range of motion. He exhibits no edema and no tenderness.  Lymphadenopathy:    He has no cervical adenopathy.  Neurological: He is alert. He exhibits normal muscle tone. Coordination normal.  Skin: Skin is warm and dry. No rash noted. No  erythema. No pallor.  Psychiatric: He has a normal mood and affect. His behavior is normal. Judgment and thought content normal.    ED Course  Procedures (including critical care time) Labs Reviewed  CBC WITH DIFFERENTIAL - Abnormal; Notable for the following:    Neutrophils Relative % 84 (*)    Neutro Abs 8.8 (*)    Lymphocytes Relative 10 (*)    All other components within normal limits  COMPREHENSIVE METABOLIC PANEL - Abnormal; Notable for the following:    Glucose, Bld 108 (*)    All other components within normal limits  LIPASE, BLOOD   ETHANOL   No results found. 1. Recurrent biliary colic     MDM  37 year old male with known cholelithiasis.  I explained to the patient that instead of having a gallstone removed, he actually needs his entire gallbladder removed.  I explained to him that indications for emergent surgery would include pain not controlled with pain medicines, signs of cholecystitis such as fever, or elevated white count, or alteration in his lab work.  Labwork today is unremarkable from his prior visits.  Patient again instructed to followup with the [place] [facility].  Olivia Mackie, MD 12/11/12 [phone]

## 2012-12-11 NOTE — ED Notes (Signed)
Pt complains of right upper quadrant pain, dx of gallstones, needs surgery, but has been seen here multiple times

## 2012-12-11 NOTE — ED Notes (Signed)
[id number]<VW> Expected date:12/11/12<BR> Expected time: 4:04 AM<BR> Means of arrival:Ambulance<BR> Comments:<BR> Rt upper quad pain

## 2013-01-14 ENCOUNTER — Encounter (HOSPITAL_COMMUNITY): Payer: Self-pay

## 2013-01-14 ENCOUNTER — Emergency Department (HOSPITAL_COMMUNITY)
Admission: EM | Admit: 2013-01-14 | Discharge: 2013-01-14 | Disposition: A | Discharge status: Home / Self Care | Payer: Self-pay | Attending: Emergency Medicine | Admitting: Emergency Medicine

## 2013-01-14 DIAGNOSIS — K8050 Calculus of bile duct without cholangitis or cholecystitis without obstruction: Secondary | ICD-10-CM

## 2013-01-14 DIAGNOSIS — K8020 Calculus of gallbladder without cholecystitis without obstruction: Secondary | ICD-10-CM | POA: Insufficient documentation

## 2013-01-14 LAB — CBC WITH DIFFERENTIAL/PLATELET
Basophils Absolute: 0 10*3/uL (ref 0.0–0.1)
Basophils Relative: 0 % (ref 0–1)
Eosinophils Absolute: 0.1 10*3/uL (ref 0.0–0.7)
Eosinophils Relative: 2 % (ref 0–5)
MCH: 32.6 pg (ref 26.0–34.0)
MCHC: 35.7 g/dL (ref 30.0–36.0)
MCV: 91.5 fL (ref 78.0–100.0)
Neutrophils Relative %: 49 % (ref 43–77)
Platelets: 255 10*3/uL (ref 150–400)
RDW: 13.3 % (ref 11.5–15.5)

## 2013-01-14 LAB — COMPREHENSIVE METABOLIC PANEL
ALT: 27 U/L (ref 0–53)
AST: 21 U/L (ref 0–37)
Albumin: 4 g/dL (ref 3.5–5.2)
Calcium: 9.1 mg/dL (ref 8.4–10.5)
GFR calc Af Amer: >90 mL/min (ref >90)
Glucose, Bld: 107 mg/dL — ABNORMAL HIGH (ref 70–99)
Potassium: 3.4 mEq/L — ABNORMAL LOW (ref 3.5–5.1)
Sodium: 138 mEq/L (ref 135–145)
Total Protein: 7.1 g/dL (ref 6.0–8.3)

## 2013-01-14 MED ORDER — HYDROMORPHONE HCL PF 1 MG/ML IJ SOLN
1.0000 mg | Freq: Once | INTRAMUSCULAR | Status: AC
  Administered 2013-01-14: 1 mg via INTRAVENOUS
  Filled 2013-01-14: qty 1

## 2013-01-14 MED ORDER — SODIUM CHLORIDE 0.9 % IV SOLN
Freq: Once | INTRAVENOUS | Status: AC
  Administered 2013-01-14: 01:00:00 via INTRAVENOUS

## 2013-01-14 MED ORDER — OXYCODONE-ACETAMINOPHEN 5-325 MG PO TABS: 1.0000 | ORAL_TABLET | ORAL | Status: DC | PRN

## 2013-01-14 NOTE — ED Notes (Signed)
Per EMS, pt c/o right upper quadrant pain. Pt. Diagnosed with gallstones in April but cannot afford surgery. Hasn't had pain since June. Out of pain medicine.

## 2013-01-14 NOTE — ED Provider Notes (Addendum)
CSN: [id number]     Arrival date & time 01/14/13  0045 History     First MD Initiated Contact with Patient 01/14/13 0058     Chief Complaint  Patient presents with  . Abdominal Pain  . Cholelithiasis   (Consider location/radiation/quality/duration/timing/severity/associated sxs/prior Treatment) Patient is a 37 y.o. male presenting with abdominal pain. The history is provided by the patient.  Abdominal Pain Associated symptoms include abdominal pain.  He has a history of gallstones diagnosed in April. He had been doing generally well until 11 PM when he developed right upper quadrant pain without radiation. There is no associated nausea or vomiting. Pain seems to move throughout his right upper quadrant. Pain is moderately severe and he rates it at 8/10. Nothing makes it better nothing makes it worse. He had last eaten at about 1 PM. Also, of note, he states that he drinks on average 80 ounces of beer a day although he has not had any today.  Past Medical History  Diagnosis Date  . Gallstones    History reviewed. No pertinent past surgical history. No family history on file. History  Substance Use Topics  . Smoking status: Never Smoker   . Smokeless tobacco: Not on file  . Alcohol Use: Yes    Review of Systems  Gastrointestinal: Positive for abdominal pain.  All other systems reviewed and are negative.    Allergies  Review of patient's allergies indicates no known allergies.  Home Medications   Current Outpatient Rx  Name  Route  Sig  Dispense  Refill  . HYDROcodone-acetaminophen (NORCO/VICODIN) 5-325 MG per tablet   Oral   Take 1-2 tablets by mouth every 6 (six) hours as needed for pain.   20 tablet   0   . hyoscyamine (LEVSIN, ANASPAZ) 0.125 MG tablet   Oral   Take 1 tablet (0.125 mg total) by mouth every 4 (four) hours as needed for cramping.   30 tablet   0   . ondansetron (ZOFRAN-ODT) 8 MG disintegrating tablet   Oral   Take 1 tablet (8 mg total) by mouth  every 8 (eight) hours as needed for nausea.   20 tablet   0    BP 148/63  Pulse 65  Temp(Src) 97.5 F (36.4 C) (Oral)  Resp 16  SpO2 100% Physical Exam  Nursing note and vitals reviewed.  37 year old male, resting comfortably and in no acute distress. Vital signs are significant for hypertension with blood pressure 140/63. Oxygen saturation is 100%, which is normal. Head is normocephalic and atraumatic. PERRLA, EOMI. Oropharynx is clear. Neck is nontender and supple without adenopathy or JVD. Back is nontender and there is no CVA tenderness. Lungs are clear without rales, wheezes, or rhonchi. Chest is nontender. Heart has regular rate and rhythm without murmur. Abdomen is soft, flat, with moderate right upper quadrant tenderness. There is no rebound or guarding. There no masses or hepatosplenomegaly and peristalsis is normoactive. Extremities have no cyanosis or edema, full range of motion is present. Skin is warm and dry without rash. Neurologic: Mental status is normal, cranial nerves are intact, there are no motor or sensory deficits.  ED Course   Procedures (including critical care time)  Results for orders placed during the hospital encounter of 01/14/13  CBC WITH DIFFERENTIAL      Result Value Range   WBC 6.0  4.0 - 10.5 K/uL   RBC 4.69  4.22 - 5.81 MIL/uL   Hemoglobin 15.3  13.0 - 17.0 g/dL  HCT 42.9  39.0 - 52.0 %   MCV 91.5  78.0 - 100.0 fL   MCH 32.6  26.0 - 34.0 pg   MCHC 35.7  30.0 - 36.0 g/dL   RDW 16.1  09.6 - 04.5 %   Platelets 255  150 - 400 K/uL   Neutrophils Relative % 49  43 - 77 %   Neutro Abs 2.9  1.7 - 7.7 K/uL   Lymphocytes Relative 43  12 - 46 %   Lymphs Abs 2.6  0.7 - 4.0 K/uL   Monocytes Relative 6  3 - 12 %   Monocytes Absolute 0.3  0.1 - 1.0 K/uL   Eosinophils Relative 2  0 - 5 %   Eosinophils Absolute 0.1  0.0 - 0.7 K/uL   Basophils Relative 0  0 - 1 %   Basophils Absolute 0.0  0.0 - 0.1 K/uL  COMPREHENSIVE METABOLIC PANEL      Result  Value Range   Sodium 138  135 - 145 mEq/L   Potassium 3.4 (*) 3.5 - 5.1 mEq/L   Chloride 104  96 - 112 mEq/L   CO2 25  19 - 32 mEq/L   Glucose, Bld 107 (*) 70 - 99 mg/dL   BUN 10  6 - 23 mg/dL   Creatinine, Ser 4.09  0.50 - 1.35 mg/dL   Calcium 9.1  8.4 - 81.1 mg/dL   Total Protein 7.1  6.0 - 8.3 g/dL   Albumin 4.0  3.5 - 5.2 g/dL   AST 21  0 - 37 U/L   ALT 27  0 - 53 U/L   Alkaline Phosphatase 50  39 - 117 U/L   Total Bilirubin 0.6  0.3 - 1.2 mg/dL   GFR calc non Af Amer >90  >90 mL/min   GFR calc Af Amer >90  >90 mL/min  LIPASE, BLOOD      Result Value Range   Lipase 39  11 - 59 U/L   1. Recurrent biliary colic     MDM  Recurrent biliary colic. Old records are reviewed and he did have an ultrasound in April of this year showing gallstones and has had 2 prior ED visits for biliary colic including the one where gallstones were diagnosed. He'll be given IV fluids and IV hydromorphone and ondansetron. Liver function tests will be checked as well as a lipase and CBC.  2:16 AM Liver function studies and lipase are normal. WBC is normal. He got good relief of pain with hydromorphone. There is no indication for emergent surgery.  He will be discharged with prescription for oxycodone-acetaminophen and referred back to [facility] [place] [facility].  Dione Booze, MD 01/14/13 9147  Dione Booze, MD 01/14/13 [phone]

## 2013-02-11 ENCOUNTER — Encounter (HOSPITAL_COMMUNITY): Payer: Self-pay | Admitting: *Deleted

## 2013-02-11 ENCOUNTER — Emergency Department (HOSPITAL_COMMUNITY)
Admission: EM | Admit: 2013-02-11 | Discharge: 2013-02-11 | Disposition: A | Discharge status: Home / Self Care | Payer: Self-pay | Attending: Emergency Medicine | Admitting: Emergency Medicine

## 2013-02-11 DIAGNOSIS — K8050 Calculus of bile duct without cholangitis or cholecystitis without obstruction: Secondary | ICD-10-CM

## 2013-02-11 DIAGNOSIS — Z8719 Personal history of other diseases of the digestive system: Secondary | ICD-10-CM | POA: Insufficient documentation

## 2013-02-11 DIAGNOSIS — K8020 Calculus of gallbladder without cholecystitis without obstruction: Secondary | ICD-10-CM | POA: Insufficient documentation

## 2013-02-11 LAB — POCT I-STAT, CHEM 8
BUN: 13 mg/dL (ref 6–23)
Calcium, Ion: 1.16 mmol/L (ref 1.12–1.23)
Creatinine, Ser: 1.1 mg/dL (ref 0.50–1.35)
TCO2: 25 mmol/L (ref 0–100)

## 2013-02-11 LAB — CBC WITH DIFFERENTIAL/PLATELET
Eosinophils Absolute: 0.1 10*3/uL (ref 0.0–0.7)
HCT: 41.9 % (ref 39.0–52.0)
Hemoglobin: 15 g/dL (ref 13.0–17.0)
Lymphs Abs: 2.2 10*3/uL (ref 0.7–4.0)
MCH: 33.2 pg (ref 26.0–34.0)
Monocytes Absolute: 0.4 10*3/uL (ref 0.1–1.0)
Monocytes Relative: 6 % (ref 3–12)
Neutrophils Relative %: 57 % (ref 43–77)
RBC: 4.52 MIL/uL (ref 4.22–5.81)

## 2013-02-11 LAB — HEPATIC FUNCTION PANEL
AST: 23 U/L (ref 0–37)
Albumin: 3.8 g/dL (ref 3.5–5.2)
Alkaline Phosphatase: 37 U/L — ABNORMAL LOW (ref 39–117)
Total Bilirubin: 0.4 mg/dL (ref 0.3–1.2)

## 2013-02-11 MED ORDER — IBUPROFEN 200 MG PO TABS
400.0000 mg | ORAL_TABLET | Freq: Once | ORAL | Status: AC
  Administered 2013-02-11: 400 mg via ORAL
  Filled 2013-02-11: qty 2

## 2013-02-11 NOTE — ED Provider Notes (Signed)
CSN: [id number]     Arrival date & time 02/11/13  0205 History     First MD Initiated Contact with Patient 02/11/13 [phone]     Chief Complaint  Patient presents with  . Abdominal Pain   (Consider location/radiation/quality/duration/timing/severity/associated sxs/prior Treatment) HPI Help right upper quadrant pain after eating fried chicken at 7:30 PM tonight. Pain feels like biliary colic he has experienced in the past. Pain is nonradiating. Denies vomiting denies fever denies other complaint. Pain is now minimal, without treatment. No other complaint. Other associated symptoms. Past Medical History  Diagnosis Date  . Gallstones    History reviewed. No pertinent past surgical history. No family history on file. History  Substance Use Topics  . Smoking status: Never Smoker   . Smokeless tobacco: Not on file  . Alcohol Use: Yes    positive marijuana use Review of Systems  Gastrointestinal: Positive for abdominal pain.  All other systems reviewed and are negative.    Allergies  Review of patient's allergies indicates no known allergies.  Home Medications   Current Outpatient Rx  Name  Route  Sig  Dispense  Refill  . oxyCODONE-acetaminophen (PERCOCET) 5-325 MG per tablet   Oral   Take 1 tablet by mouth every 4 (four) hours as needed for pain.   20 tablet   0    BP 145/88  Pulse 55  Temp(Src) 97.9 F (36.6 C) (Oral)  Resp 17  SpO2 99% Physical Exam  Nursing note and vitals reviewed. Constitutional: He appears well-developed and well-nourished.  HENT:  Head: Normocephalic and atraumatic.  Eyes: Conjunctivae are normal. Pupils are equal, round, and reactive to light.  Neck: Neck supple. No tracheal deviation present. No thyromegaly present.  Cardiovascular: Normal rate and regular rhythm.   No murmur heard. Pulmonary/Chest: Effort normal and breath sounds normal.  Abdominal: Soft. Bowel sounds are normal. He exhibits no distension and no mass. There is tenderness.  There is no rebound and no guarding.  Minimally tender at right upper quadrant  Musculoskeletal: Normal range of motion. He exhibits no edema and no tenderness.  Neurological: He is alert. Coordination normal.  Skin: Skin is warm and dry. No rash noted.  Psychiatric: He has a normal mood and affect.    ED Course   Procedures (including critical care time)  Labs Reviewed  CBC WITH DIFFERENTIAL  HEPATIC FUNCTION PANEL  LIPASE, BLOOD   No results found. No diagnosis found. Declines pain medicine  4 AM patient resting comfortably. Abdominal discomfort is minimal. Results for orders placed during the hospital encounter of 02/11/13  CBC WITH DIFFERENTIAL      Result Value Range   WBC 6.3  4.0 - 10.5 K/uL   RBC 4.52  4.22 - 5.81 MIL/uL   Hemoglobin 15.0  13.0 - 17.0 g/dL   HCT 11.9  14.7 - 82.9 %   MCV 92.7  78.0 - 100.0 fL   MCH 33.2  26.0 - 34.0 pg   MCHC 35.8  30.0 - 36.0 g/dL   RDW 56.2  13.0 - 86.5 %   Platelets 287  150 - 400 K/uL   Neutrophils Relative % 57  43 - 77 %   Neutro Abs 3.6  1.7 - 7.7 K/uL   Lymphocytes Relative 36  12 - 46 %   Lymphs Abs 2.2  0.7 - 4.0 K/uL   Monocytes Relative 6  3 - 12 %   Monocytes Absolute 0.4  0.1 - 1.0 K/uL   Eosinophils Relative 1  0 - 5 %   Eosinophils Absolute 0.1  0.0 - 0.7 K/uL   Basophils Relative 0  0 - 1 %   Basophils Absolute 0.0  0.0 - 0.1 K/uL  HEPATIC FUNCTION PANEL      Result Value Range   Total Protein 7.0  6.0 - 8.3 g/dL   Albumin 3.8  3.5 - 5.2 g/dL   AST 23  0 - 37 U/L   ALT 26  0 - 53 U/L   Alkaline Phosphatase 37 (*) 39 - 117 U/L   Total Bilirubin 0.4  0.3 - 1.2 mg/dL   Bilirubin, Direct <1.6  0.0 - 0.3 mg/dL   Indirect Bilirubin NOT CALCULATED  0.3 - 0.9 mg/dL  LIPASE, BLOOD      Result Value Range   Lipase 35  11 - 59 U/L  POCT I-STAT, CHEM 8      Result Value Range   Sodium 140  135 - 145 mEq/L   Potassium 3.5  3.5 - 5.1 mEq/L   Chloride 106  96 - 112 mEq/L   BUN 13  6 - 23 mg/dL   Creatinine, Ser  1.09  0.50 - 1.35 mg/dL   Glucose, Bld 604 (*) 70 - 99 mg/dL   Calcium, Ion 5.40  9.81 - 1.23 mmol/L   TCO2 25  0 - 100 mmol/L   Hemoglobin 15.6  13.0 - 17.0 g/dL   HCT 19.1  47.8 - 29.5 %   No results found.  MDM  Plan Advil for pain. Patient more advised to avoid greasy and fatty foods. Referral Dr. Donell Beers Diagnosis biliary colic  Doug Sou, MD 02/11/13 [phone]

## 2013-02-11 NOTE — ED Notes (Signed)
Per EMS, pt has had upper right quadrant pain since 2230 tonight.  Pt states pain is radiating to his back.  Pt states he has a history of gallstones and thinks this might be the same.  Pt states he has been vomiting, but none witnessed by EMS.  Denies blood in vomiting, and states he has started new medication, but does not know the name.

## 2013-02-11 NOTE — ED Notes (Signed)
Pt states that abd pain started around 1030p last night. He states this is similar to gallstone pain which he has had before.

## 2013-03-11 ENCOUNTER — Emergency Department (HOSPITAL_COMMUNITY)
Admission: EM | Admit: 2013-03-11 | Discharge: 2013-03-11 | Disposition: A | Discharge status: Home / Self Care | Payer: Self-pay | Attending: Emergency Medicine | Admitting: Emergency Medicine

## 2013-03-11 ENCOUNTER — Encounter (HOSPITAL_COMMUNITY): Payer: Self-pay | Admitting: Emergency Medicine

## 2013-03-11 DIAGNOSIS — K8050 Calculus of bile duct without cholangitis or cholecystitis without obstruction: Secondary | ICD-10-CM

## 2013-03-11 DIAGNOSIS — K8020 Calculus of gallbladder without cholecystitis without obstruction: Secondary | ICD-10-CM | POA: Insufficient documentation

## 2013-03-11 LAB — CBC WITH DIFFERENTIAL/PLATELET
Basophils Absolute: 0 10*3/uL (ref 0.0–0.1)
Eosinophils Absolute: 0.1 10*3/uL (ref 0.0–0.7)
Eosinophils Relative: 2 % (ref 0–5)
Hemoglobin: 15.2 g/dL (ref 13.0–17.0)
Lymphocytes Relative: 38 % (ref 12–46)
Lymphs Abs: 2.1 10*3/uL (ref 0.7–4.0)
MCH: 33 pg (ref 26.0–34.0)
MCV: 91.1 fL (ref 78.0–100.0)
Neutro Abs: 2.9 10*3/uL (ref 1.7–7.7)
Neutrophils Relative %: 54 % (ref 43–77)
Platelets: 287 10*3/uL (ref 150–400)
RDW: 12.9 % (ref 11.5–15.5)
WBC: 5.5 10*3/uL (ref 4.0–10.5)

## 2013-03-11 LAB — COMPREHENSIVE METABOLIC PANEL
ALT: 20 U/L (ref 0–53)
AST: 22 U/L (ref 0–37)
Calcium: 9.3 mg/dL (ref 8.4–10.5)
Chloride: 102 mEq/L (ref 96–112)
GFR calc non Af Amer: >90 mL/min (ref >90)
Sodium: 137 mEq/L (ref 135–145)
Total Protein: 7.3 g/dL (ref 6.0–8.3)

## 2013-03-11 LAB — URINALYSIS, ROUTINE W REFLEX MICROSCOPIC
Glucose, UA: NEGATIVE mg/dL
Hgb urine dipstick: NEGATIVE
Ketones, ur: NEGATIVE mg/dL
Specific Gravity, Urine: 1.021 (ref 1.005–1.030)
Urobilinogen, UA: 0.2 mg/dL (ref 0.0–1.0)

## 2013-03-11 LAB — LIPASE, BLOOD: Lipase: 46 U/L (ref 11–59)

## 2013-03-11 MED ORDER — OXYCODONE-ACETAMINOPHEN 5-325 MG PO TABS: 1.0000 | ORAL_TABLET | Freq: Four times a day (QID) | ORAL | Status: DC | PRN

## 2013-03-11 MED ORDER — OXYCODONE-ACETAMINOPHEN 5-325 MG PO TABS: 2.0000 | ORAL_TABLET | Freq: Once | ORAL | Status: DC

## 2013-03-11 MED ORDER — OXYCODONE-ACETAMINOPHEN 5-325 MG PO TABS
1.0000 | ORAL_TABLET | Freq: Once | ORAL | Status: AC
  Administered 2013-03-11: 1 via ORAL
  Filled 2013-03-11: qty 1

## 2013-03-11 MED ORDER — KETOROLAC TROMETHAMINE 30 MG/ML IJ SOLN
30.0000 mg | Freq: Once | INTRAMUSCULAR | Status: AC
  Administered 2013-03-11: 30 mg via INTRAMUSCULAR
  Filled 2013-03-11: qty 1

## 2013-03-11 NOTE — ED Notes (Signed)
Pt. reports RUQ pain onset this afternoon , deneis nausea /vomitting or diarrhea , pt.stated history of gallstones.

## 2013-03-11 NOTE — ED Provider Notes (Signed)
CSN: [id number]     Arrival date & time 03/11/13  1742 History   First MD Initiated Contact with Patient 03/11/13 2055     Chief Complaint  Patient presents with  . Abdominal Pain   (Consider location/radiation/quality/duration/timing/severity/associated sxs/prior Treatment) HPI Comments: Patient with a known history of gallstones without cholecystitis.  States, at 3:30 this afternoon.  He developed right upper cord and pain.  Not associated with nausea, or vomiting.  He, states, that for breakfast.  This morning.  He had bacon and eggs and then at lunch.  She had a hamburger, french fries, and fried chicken the pain started shortly after his last meal.  he has not taken any medication for pain  Patient is a 37 y.o. male presenting with abdominal pain. The history is provided by the patient.  Abdominal Pain Pain location:  RUQ Pain quality: aching and pressure   Pain radiates to:  Does not radiate Pain severity:  Moderate Onset quality:  Sudden Duration:  6 hours Timing:  Constant Progression:  Unchanged Chronicity:  Recurrent Context: eating   Relieved by:  Nothing Worsened by:  Eating Associated symptoms: no chills, no cough, no diarrhea, no dysuria, no fever, no flatus, no nausea, no shortness of breath and no vomiting     Past Medical History  Diagnosis Date  . Gallstones    History reviewed. No pertinent past surgical history. No family history on file. History  Substance Use Topics  . Smoking status: Never Smoker   . Smokeless tobacco: Not on file  . Alcohol Use: Yes    Review of Systems  Constitutional: Negative for fever and chills.  Respiratory: Negative for cough and shortness of breath.   Gastrointestinal: Positive for abdominal pain. Negative for nausea, vomiting, diarrhea and flatus.  Genitourinary: Negative for dysuria.  All other systems reviewed and are negative.    Allergies  Review of patient's allergies indicates no known allergies.  Home  Medications   Current Outpatient Rx  Name  Route  Sig  Dispense  Refill  . oxyCODONE-acetaminophen (PERCOCET/ROXICET) 5-325 MG per tablet   Oral   Take 1 tablet by mouth every 6 (six) hours as needed for pain.   12 tablet   0    BP 123/68  Pulse 53  Temp(Src) 98.5 F (36.9 C) (Oral)  Resp 18  Ht 5\' 7"  (1.702 m)  Wt 170 lb (77.111 kg)  BMI 26.62 kg/m2  SpO2 99% Physical Exam  Nursing note and vitals reviewed. Constitutional: He is oriented to person, place, and time. He appears well-developed and well-nourished.  Eyes: Pupils are equal, round, and reactive to light.  Cardiovascular: Normal rate.   Pulmonary/Chest: Effort normal.  Abdominal: Soft. He exhibits no distension. There is tenderness in the right upper quadrant.  Musculoskeletal: Normal range of motion.  Neurological: He is alert and oriented to person, place, and time.  Skin: Skin is warm. No erythema.    ED Course  Procedures (including critical care time) Labs Review Labs Reviewed  CBC WITH DIFFERENTIAL - Abnormal; Notable for the following:    MCHC 36.2 (*)    All other components within normal limits  COMPREHENSIVE METABOLIC PANEL - Abnormal; Notable for the following:    Glucose, Bld 148 (*)    All other components within normal limits  LIPASE, BLOOD  URINALYSIS, ROUTINE W REFLEX MICROSCOPIC   Imaging Review No results found.  MDM   1. Biliary colic     Patient denies any nausea, vomiting, fever,  chills, will treat with Percocet, and reassess his labs have been reviewed and they are all within normal parameters After second Percocet and 30 of Toradol, IM.  Patient has significant relief of his pain.  Will discharge him home with prescription for Percocet, and general surgery followup if he has persistent pain after eating fatty meals.  He is also has been  cautioned about diet  Arman Filter, NP 03/11/13 2312

## 2013-03-12 NOTE — ED Provider Notes (Signed)
Medical screening examination/treatment/procedure(s) were performed by non-physician practitioner and as supervising physician I was immediately available for consultation/collaboration.   Aneth Schlagel, MD 03/12/13 0008 

## 2013-03-18 ENCOUNTER — Encounter (HOSPITAL_COMMUNITY): Payer: Self-pay | Admitting: Adult Health

## 2013-03-18 DIAGNOSIS — K8000 Calculus of gallbladder with acute cholecystitis without obstruction: Principal | ICD-10-CM | POA: Insufficient documentation

## 2013-03-18 DIAGNOSIS — Z23 Encounter for immunization: Secondary | ICD-10-CM | POA: Insufficient documentation

## 2013-03-18 NOTE — ED Notes (Signed)
Presents with RUQ abdominal pain began at 8 am today, reports hx of gallstones, this feels the same, denies nausea. Pain is described as "being kicked in the stomach" does not radiate. Nothing makes pain better, nothing makes pain worse.

## 2013-03-19 ENCOUNTER — Encounter (HOSPITAL_COMMUNITY): Payer: Self-pay | Admitting: Certified Registered"

## 2013-03-19 ENCOUNTER — Emergency Department (HOSPITAL_COMMUNITY): Payer: Self-pay

## 2013-03-19 ENCOUNTER — Observation Stay (HOSPITAL_COMMUNITY)
Admission: EM | Admit: 2013-03-19 | Discharge: 2013-03-20 | Disposition: A | Discharge status: Home / Self Care | Payer: Self-pay | Attending: Surgery | Admitting: Surgery

## 2013-03-19 ENCOUNTER — Observation Stay (HOSPITAL_COMMUNITY): Payer: Self-pay | Admitting: Anesthesiology

## 2013-03-19 ENCOUNTER — Observation Stay (HOSPITAL_COMMUNITY): Payer: Self-pay

## 2013-03-19 ENCOUNTER — Encounter (HOSPITAL_COMMUNITY): Payer: Self-pay | Admitting: Anesthesiology

## 2013-03-19 ENCOUNTER — Encounter (HOSPITAL_COMMUNITY): Payer: Self-pay | Admitting: *Deleted

## 2013-03-19 ENCOUNTER — Encounter (HOSPITAL_COMMUNITY)
Admission: EM | Disposition: A | Discharge status: Home / Self Care | Payer: Self-pay | Source: Home / Self Care | Attending: Emergency Medicine

## 2013-03-19 DIAGNOSIS — K8000 Calculus of gallbladder with acute cholecystitis without obstruction: Secondary | ICD-10-CM | POA: Diagnosis present

## 2013-03-19 DIAGNOSIS — R1011 Right upper quadrant pain: Secondary | ICD-10-CM

## 2013-03-19 DIAGNOSIS — K8020 Calculus of gallbladder without cholecystitis without obstruction: Secondary | ICD-10-CM

## 2013-03-19 HISTORY — PX: CHOLECYSTECTOMY: SHX55

## 2013-03-19 LAB — CBC
Hemoglobin: 15.7 g/dL (ref 13.0–17.0)
MCH: 33.4 pg (ref 26.0–34.0)
Platelets: 264 10*3/uL (ref 150–400)
RBC: 4.7 MIL/uL (ref 4.22–5.81)
WBC: 11.4 10*3/uL — ABNORMAL HIGH (ref 4.0–10.5)

## 2013-03-19 LAB — COMPREHENSIVE METABOLIC PANEL
ALT: 22 U/L (ref 0–53)
AST: 18 U/L (ref 0–37)
CO2: 25 mEq/L (ref 19–32)
Calcium: 9.3 mg/dL (ref 8.4–10.5)
Chloride: 101 mEq/L (ref 96–112)
GFR calc non Af Amer: >90 mL/min (ref >90)
Sodium: 138 mEq/L (ref 135–145)
Total Bilirubin: 0.4 mg/dL (ref 0.3–1.2)

## 2013-03-19 LAB — CBC WITH DIFFERENTIAL/PLATELET
Basophils Absolute: 0 10*3/uL (ref 0.0–0.1)
Basophils Relative: 0 % (ref 0–1)
Eosinophils Absolute: 0.1 10*3/uL (ref 0.0–0.7)
Eosinophils Relative: 1 % (ref 0–5)
Hemoglobin: 15.1 g/dL (ref 13.0–17.0)
MCH: 32 pg (ref 26.0–34.0)
MCHC: 34.7 g/dL (ref 30.0–36.0)
MCV: 92.2 fL (ref 78.0–100.0)
Monocytes Absolute: 0.3 10*3/uL (ref 0.1–1.0)
Monocytes Relative: 6 % (ref 3–12)
Neutrophils Relative %: 44 % (ref 43–77)
Platelets: 269 10*3/uL (ref 150–400)
RDW: 13 % (ref 11.5–15.5)

## 2013-03-19 LAB — SURGICAL PCR SCREEN
MRSA, PCR: NEGATIVE
Staphylococcus aureus: NEGATIVE

## 2013-03-19 LAB — CREATININE, SERUM
Creatinine, Ser: 0.98 mg/dL (ref 0.50–1.35)
GFR calc Af Amer: >90 mL/min (ref >90)
GFR calc non Af Amer: >90 mL/min (ref >90)

## 2013-03-19 LAB — LIPASE, BLOOD: Lipase: 42 U/L (ref 11–59)

## 2013-03-19 SURGERY — LAPAROSCOPIC CHOLECYSTECTOMY WITH INTRAOPERATIVE CHOLANGIOGRAM
Anesthesia: General | Site: Abdomen | Wound class: Clean Contaminated

## 2013-03-19 MED ORDER — SODIUM CHLORIDE 0.9 % IV SOLN
INTRAVENOUS | Status: DC
  Administered 2013-03-19: 11:00:00 via INTRAVENOUS

## 2013-03-19 MED ORDER — 0.9 % SODIUM CHLORIDE (POUR BTL) OPTIME
TOPICAL | Status: DC | PRN
  Administered 2013-03-19: 1000 mL

## 2013-03-19 MED ORDER — ONDANSETRON HCL 4 MG PO TABS: 4.0000 mg | ORAL_TABLET | Freq: Four times a day (QID) | ORAL | Status: DC | PRN

## 2013-03-19 MED ORDER — GLYCOPYRROLATE 0.2 MG/ML IJ SOLN
INTRAMUSCULAR | Status: DC | PRN
  Administered 2013-03-19: 0.6 mg via INTRAVENOUS

## 2013-03-19 MED ORDER — POTASSIUM CHLORIDE 10 MEQ/100ML IV SOLN
10.0000 meq | Freq: Once | INTRAVENOUS | Status: AC
  Administered 2013-03-19: 10 meq via INTRAVENOUS
  Filled 2013-03-19: qty 100

## 2013-03-19 MED ORDER — LIDOCAINE HCL (CARDIAC) 20 MG/ML IV SOLN
INTRAVENOUS | Status: DC | PRN
  Administered 2013-03-19: 60 mg via INTRAVENOUS

## 2013-03-19 MED ORDER — ONDANSETRON HCL 4 MG PO TABS: 4.0000 mg | ORAL_TABLET | Freq: Four times a day (QID) | ORAL | Status: DC

## 2013-03-19 MED ORDER — PNEUMOCOCCAL VAC POLYVALENT 25 MCG/0.5ML IJ INJ
0.5000 mL | INJECTION | INTRAMUSCULAR | Status: AC
  Administered 2013-03-20: 0.5 mL via INTRAMUSCULAR
  Filled 2013-03-19: qty 0.5

## 2013-03-19 MED ORDER — SODIUM CHLORIDE 0.9 % IR SOLN
Status: DC | PRN
  Administered 2013-03-19: 1

## 2013-03-19 MED ORDER — OXYCODONE-ACETAMINOPHEN 5-325 MG PO TABS
1.0000 | ORAL_TABLET | ORAL | Status: DC | PRN
  Administered 2013-03-20: 1 via ORAL
  Filled 2013-03-19: qty 1

## 2013-03-19 MED ORDER — BUPIVACAINE-EPINEPHRINE PF 0.25-1:200000 % IJ SOLN
INTRAMUSCULAR | Status: AC
  Filled 2013-03-19: qty 30

## 2013-03-19 MED ORDER — HYDROMORPHONE HCL PF 1 MG/ML IJ SOLN
INTRAMUSCULAR | Status: AC
  Filled 2013-03-19: qty 1

## 2013-03-19 MED ORDER — ONDANSETRON HCL 4 MG/2ML IJ SOLN: 4.0000 mg | Freq: Four times a day (QID) | INTRAMUSCULAR | Status: DC | PRN

## 2013-03-19 MED ORDER — ONDANSETRON HCL 4 MG/2ML IJ SOLN
4.0000 mg | Freq: Once | INTRAMUSCULAR | Status: AC
  Administered 2013-03-19: 4 mg via INTRAVENOUS
  Filled 2013-03-19: qty 2

## 2013-03-19 MED ORDER — MORPHINE SULFATE 2 MG/ML IJ SOLN: 2.0000 mg | INTRAMUSCULAR | Status: DC | PRN

## 2013-03-19 MED ORDER — FENTANYL CITRATE 0.05 MG/ML IJ SOLN
INTRAMUSCULAR | Status: DC | PRN
  Administered 2013-03-19 (×2): 50 ug via INTRAVENOUS
  Administered 2013-03-19: 100 ug via INTRAVENOUS

## 2013-03-19 MED ORDER — DEXAMETHASONE SODIUM PHOSPHATE 10 MG/ML IJ SOLN
INTRAMUSCULAR | Status: DC | PRN
  Administered 2013-03-19: 4 mg via INTRAVENOUS

## 2013-03-19 MED ORDER — HYDROMORPHONE HCL PF 1 MG/ML IJ SOLN: 0.2500 mg | INTRAMUSCULAR | Status: DC | PRN

## 2013-03-19 MED ORDER — OXYCODONE HCL 5 MG PO TABS: 5.0000 mg | ORAL_TABLET | Freq: Once | ORAL | Status: DC | PRN

## 2013-03-19 MED ORDER — INFLUENZA VAC SPLIT QUAD 0.5 ML IM SUSP
0.5000 mL | INTRAMUSCULAR | Status: AC
  Administered 2013-03-20: 0.5 mL via INTRAMUSCULAR
  Filled 2013-03-19: qty 0.5

## 2013-03-19 MED ORDER — ENOXAPARIN SODIUM 40 MG/0.4ML ~~LOC~~ SOLN
40.0000 mg | SUBCUTANEOUS | Status: DC
  Administered 2013-03-20: 11:00:00 40 mg via SUBCUTANEOUS
  Filled 2013-03-19 (×2): qty 0.4

## 2013-03-19 MED ORDER — BUPIVACAINE-EPINEPHRINE 0.25% -1:200000 IJ SOLN
INTRAMUSCULAR | Status: DC | PRN
  Administered 2013-03-19: 15 mL

## 2013-03-19 MED ORDER — PROMETHAZINE HCL 25 MG/ML IJ SOLN: 6.2500 mg | INTRAMUSCULAR | Status: DC | PRN

## 2013-03-19 MED ORDER — SODIUM CHLORIDE 0.9 % IV SOLN
3.0000 g | Freq: Four times a day (QID) | INTRAVENOUS | Status: AC
  Administered 2013-03-19 – 2013-03-20 (×3): 3 g via INTRAVENOUS
  Filled 2013-03-19 (×3): qty 3

## 2013-03-19 MED ORDER — NEOSTIGMINE METHYLSULFATE 1 MG/ML IJ SOLN
INTRAMUSCULAR | Status: DC | PRN
  Administered 2013-03-19: 4 mg via INTRAVENOUS

## 2013-03-19 MED ORDER — SODIUM CHLORIDE 0.9 % IV SOLN
INTRAVENOUS | Status: DC | PRN
  Administered 2013-03-19: 15:00:00

## 2013-03-19 MED ORDER — ONDANSETRON HCL 4 MG/2ML IJ SOLN
INTRAMUSCULAR | Status: DC | PRN
  Administered 2013-03-19: 4 mg via INTRAVENOUS

## 2013-03-19 MED ORDER — LACTATED RINGERS IV SOLN
INTRAVENOUS | Status: DC | PRN
  Administered 2013-03-19: 14:00:00 via INTRAVENOUS

## 2013-03-19 MED ORDER — ACETAMINOPHEN 325 MG PO TABS: 650.0000 mg | ORAL_TABLET | ORAL | Status: DC | PRN

## 2013-03-19 MED ORDER — HYDROMORPHONE HCL PF 1 MG/ML IJ SOLN: 1.0000 mg | INTRAMUSCULAR | Status: DC | PRN

## 2013-03-19 MED ORDER — LACTATED RINGERS IV SOLN
INTRAVENOUS | Status: DC
  Administered 2013-03-19: 20 mL/h via INTRAVENOUS

## 2013-03-19 MED ORDER — SODIUM CHLORIDE 0.9 % IV SOLN
INTRAVENOUS | Status: DC
  Administered 2013-03-19 – 2013-03-20 (×3): via INTRAVENOUS

## 2013-03-19 MED ORDER — OXYCODONE-ACETAMINOPHEN 5-325 MG PO TABS: 2.0000 | ORAL_TABLET | ORAL | Status: DC | PRN

## 2013-03-19 MED ORDER — SODIUM CHLORIDE 0.9 % IV SOLN
3.0000 g | Freq: Four times a day (QID) | INTRAVENOUS | Status: DC
  Administered 2013-03-19: 11:00:00 3 g via INTRAVENOUS
  Filled 2013-03-19 (×4): qty 3

## 2013-03-19 MED ORDER — FENTANYL CITRATE 0.05 MG/ML IJ SOLN: 50.0000 ug | INTRAMUSCULAR | Status: DC | PRN

## 2013-03-19 MED ORDER — ROCURONIUM BROMIDE 100 MG/10ML IV SOLN
INTRAVENOUS | Status: DC | PRN
  Administered 2013-03-19: 40 mg via INTRAVENOUS

## 2013-03-19 MED ORDER — OXYCODONE HCL 5 MG/5ML PO SOLN: 5.0000 mg | Freq: Once | ORAL | Status: DC | PRN

## 2013-03-19 MED ORDER — PROPOFOL 10 MG/ML IV BOLUS
INTRAVENOUS | Status: DC | PRN
  Administered 2013-03-19: 200 mg via INTRAVENOUS

## 2013-03-19 SURGICAL SUPPLY — 47 items
APL SKNCLS STERI-STRIP NONHPOA (GAUZE/BANDAGES/DRESSINGS) ×1
APPLIER CLIP ROT 10 11.4 M/L (STAPLE) ×2
APR CLP MED LRG 11.4X10 (STAPLE) ×1
BAG SPEC RTRVL LRG 6X4 10 (ENDOMECHANICALS) ×1
BENZOIN TINCTURE PRP APPL 2/3 (GAUZE/BANDAGES/DRESSINGS) ×2 IMPLANT
BLADE SURG ROTATE 9660 (MISCELLANEOUS) IMPLANT
CANISTER SUCTION 2500CC (MISCELLANEOUS) ×2 IMPLANT
CHLORAPREP W/TINT 26ML (MISCELLANEOUS) ×2 IMPLANT
CLIP APPLIE ROT 10 11.4 M/L (STAPLE) ×1 IMPLANT
CLOTH BEACON ORANGE TIMEOUT ST (SAFETY) ×2 IMPLANT
COVER MAYO STAND STRL (DRAPES) ×2 IMPLANT
COVER SURGICAL LIGHT HANDLE (MISCELLANEOUS) ×2 IMPLANT
DECANTER SPIKE VIAL GLASS SM (MISCELLANEOUS) ×4 IMPLANT
DRAPE C-ARM 42X72 X-RAY (DRAPES) ×2 IMPLANT
DRAPE UTILITY 15X26 W/TAPE STR (DRAPE) ×4 IMPLANT
DRSG TEGADERM 2-3/8X2-3/4 SM (GAUZE/BANDAGES/DRESSINGS) ×6 IMPLANT
DRSG TEGADERM 4X4.75 (GAUZE/BANDAGES/DRESSINGS) ×2 IMPLANT
ELECT REM PT RETURN 9FT ADLT (ELECTROSURGICAL) ×2
ELECTRODE REM PT RTRN 9FT ADLT (ELECTROSURGICAL) ×1 IMPLANT
FILTER SMOKE EVAC LAPAROSHD (FILTER) ×2 IMPLANT
GAUZE SPONGE 2X2 8PLY STRL LF (GAUZE/BANDAGES/DRESSINGS) ×1 IMPLANT
GLOVE BIO SURGEON STRL SZ7 (GLOVE) ×2 IMPLANT
GLOVE BIOGEL PI IND STRL 6.5 (GLOVE) IMPLANT
GLOVE BIOGEL PI IND STRL 7.5 (GLOVE) ×1 IMPLANT
GLOVE BIOGEL PI INDICATOR 6.5 (GLOVE) ×1
GLOVE BIOGEL PI INDICATOR 7.5 (GLOVE) ×2
GLOVE SURG SS PI 6.5 STRL IVOR (GLOVE) ×1 IMPLANT
GLOVE SURG SS PI 7.5 STRL IVOR (GLOVE) ×1 IMPLANT
GOWN STRL NON-REIN LRG LVL3 (GOWN DISPOSABLE) ×8 IMPLANT
KIT BASIN OR (CUSTOM PROCEDURE TRAY) ×2 IMPLANT
KIT ROOM TURNOVER OR (KITS) ×2 IMPLANT
NS IRRIG 1000ML POUR BTL (IV SOLUTION) ×2 IMPLANT
PAD ARMBOARD 7.5X6 YLW CONV (MISCELLANEOUS) ×2 IMPLANT
POUCH SPECIMEN RETRIEVAL 10MM (ENDOMECHANICALS) ×2 IMPLANT
SCISSORS LAP 5X35 DISP (ENDOMECHANICALS) IMPLANT
SET CHOLANGIOGRAPH 5 50 .035 (SET/KITS/TRAYS/PACK) ×2 IMPLANT
SET IRRIG TUBING LAPAROSCOPIC (IRRIGATION / IRRIGATOR) ×2 IMPLANT
SLEEVE ENDOPATH XCEL 5M (ENDOMECHANICALS) ×2 IMPLANT
SPECIMEN JAR SMALL (MISCELLANEOUS) ×2 IMPLANT
SPONGE GAUZE 2X2 STER 10/PKG (GAUZE/BANDAGES/DRESSINGS) ×1
SUT MNCRL AB 4-0 PS2 18 (SUTURE) ×2 IMPLANT
TOWEL OR 17X24 6PK STRL BLUE (TOWEL DISPOSABLE) ×2 IMPLANT
TOWEL OR 17X26 10 PK STRL BLUE (TOWEL DISPOSABLE) ×2 IMPLANT
TRAY LAPAROSCOPIC (CUSTOM PROCEDURE TRAY) ×2 IMPLANT
TROCAR XCEL BLUNT TIP 100MML (ENDOMECHANICALS) ×2 IMPLANT
TROCAR XCEL NON-BLD 11X100MML (ENDOMECHANICALS) ×2 IMPLANT
TROCAR XCEL NON-BLD 5MMX100MML (ENDOMECHANICALS) ×2 IMPLANT

## 2013-03-19 NOTE — Anesthesia Preprocedure Evaluation (Addendum)
Anesthesia Evaluation  Patient identified by MRN, date of birth, ID band Patient awake    Reviewed: Allergy & Precautions, H&P , NPO status   History of Anesthesia Complications Negative for: history of anesthetic complications  Airway Mallampati: I TM Distance: >3 FB Neck ROM: Full    Dental  (+) Dental Advisory Given   Pulmonary neg pulmonary ROS, Current Smoker,  breath sounds clear to auscultation        Cardiovascular negative cardio ROS  Rhythm:Regular Rate:Normal     Neuro/Psych negative neurological ROS     GI/Hepatic negative GI ROS, Neg liver ROS,   Endo/Other  negative endocrine ROS  Renal/GU negative Renal ROS     Musculoskeletal   Abdominal   Peds  Hematology negative hematology ROS (+)   Anesthesia Other Findings   Reproductive/Obstetrics                          Anesthesia Physical Anesthesia Plan  ASA: II  Anesthesia Plan: General   Post-op Pain Management:    Induction: Intravenous  Airway Management Planned: Oral ETT  Additional Equipment:   Intra-op Plan:   Post-operative Plan: Extubation in OR  Informed Consent: I have reviewed the patients History and Physical, chart, labs and discussed the procedure including the risks, benefits and alternatives for the proposed anesthesia with the patient or authorized representative who has indicated his/her understanding and acceptance.   Dental advisory given  Plan Discussed with: CRNA, Surgeon and Anesthesiologist  Anesthesia Plan Comments:        Anesthesia Quick Evaluation

## 2013-03-19 NOTE — Op Note (Signed)
Laparoscopic Cholecystectomy with IOC Procedure Note  Indications: This patient presents with symptomatic gallbladder disease and will undergo laparoscopic cholecystectomy.  Pre-operative Diagnosis: Calculus of gallbladder with acute cholecystitis, without mention of obstruction  Post-operative Diagnosis: Same  Surgeon: Elenor Wildes K.   Assistants: Dr. Violeta Gelinas   Anesthesia: General endotracheal anesthesia  ASA Class: 2  Procedure Details  The patient was seen again in the Holding Room. The risks, benefits, complications, treatment options, and expected outcomes were discussed with the patient. The possibilities of reaction to medication, pulmonary aspiration, perforation of viscus, bleeding, recurrent infection, finding a normal gallbladder, the need for additional procedures, failure to diagnose a condition, the possible need to convert to an open procedure, and creating a complication requiring transfusion or operation were discussed with the patient. The likelihood of improving the patient's symptoms with return to their baseline status is good.  The patient and/or family concurred with the proposed plan, giving informed consent. The site of surgery properly noted. The patient was taken to Operating Room, identified as Fred Mendez and the procedure verified as Laparoscopic Cholecystectomy with Intraoperative Cholangiogram. A Time Out was held and the above information confirmed.  Prior to the induction of general anesthesia, antibiotic prophylaxis was administered. General endotracheal anesthesia was then administered and tolerated well. After the induction, the abdomen was prepped with Chloraprep and draped in the sterile fashion. The patient was positioned in the supine position.  Local anesthetic agent was injected into the skin near the umbilicus and an incision made. We dissected down to the abdominal fascia with blunt dissection.  The fascia was incised vertically and we  entered the peritoneal cavity bluntly.  A pursestring suture of 0-Vicryl was placed around the fascial opening.  The Hasson cannula was inserted and secured with the stay suture.  Pneumoperitoneum was then created with CO2 and tolerated well without any adverse changes in the patient's vital signs. An 11-mm port was placed in the subxiphoid position.  Two 5-mm ports were placed in the right upper quadrant. All skin incisions were infiltrated with a local anesthetic agent before making the incision and placing the trocars.   We positioned the patient in reverse Trendelenburg, tilted slightly to the patient's left.  The gallbladder was identified, the fundus grasped and retracted cephalad.  The patient has a very long Adhesions were lysed bluntly and with the electrocautery where indicated, taking care not to injure any adjacent organs or viscus. The infundibulum was grasped and retracted laterally, exposing the peritoneum overlying the triangle of Calot. This was then divided and exposed in a blunt fashion. A critical view of the cystic duct and cystic artery was obtained.  The cystic duct was clearly identified and bluntly dissected circumferentially. The cystic duct was ligated with a clip distally.   An incision was made in the cystic duct and the [facility] cholangiogram catheter introduced. The catheter was secured using a clip. A cholangiogram was then obtained which showed good visualization of the distal and proximal biliary tree with no sign of filling defects or obstruction.  Contrast flowed easily into the duodenum. The catheter was then removed.   The cystic duct was then ligated with clips and divided. The cystic artery was identified, dissected free, ligated with clips and divided as well.   The gallbladder was dissected from the liver bed in retrograde fashion with the electrocautery. The gallbladder was removed and placed in an Endocatch sac. The liver bed was irrigated and inspected. Hemostasis was  achieved with the electrocautery. Copious  irrigation was utilized and was repeatedly aspirated until clear.  The gallbladder and Endocatch sac were then removed through the umbilical port site.  The pursestring suture was used to close the umbilical fascia.    We again inspected the right upper quadrant for hemostasis.  Pneumoperitoneum was released as we removed the trocars.  4-0 Monocryl was used to close the skin.   Benzoin, steri-strips, and clean dressings were applied. The patient was then extubated and brought to the recovery room in stable condition. Instrument, sponge, and needle counts were correct at closure and at the conclusion of the case.   Findings: Cholecystitis with Cholelithiasis  Estimated Blood Loss: Minimal         Drains: none         Specimens: Gallbladder           Complications: None; patient tolerated the procedure well.         Disposition: PACU - hemodynamically stable.         Condition: stable  Wilmon Arms. Corliss Skains, MD, [facility] Surgery  General/ Trauma Surgery  03/19/2013 3:12 PM

## 2013-03-19 NOTE — Anesthesia Procedure Notes (Signed)
Procedure Name: Intubation Date/Time: 03/12/2013 2:15 PM Performed by: Ellin Goodie Pre-anesthesia Checklist: Patient identified, Emergency Drugs available, Patient being monitored, Suction available and Timeout performed Patient Re-evaluated:Patient Re-evaluated prior to inductionOxygen Delivery Method: Circle system utilized Preoxygenation: Pre-oxygenation with 100% oxygen Intubation Type: IV induction Ventilation: Mask ventilation without difficulty Laryngoscope Size: Mac and 4 Grade View: Grade I Tube type: Oral Tube size: 7.5 mm Number of attempts: 1 Airway Equipment and Method: Stylet Placement Confirmation: ETT inserted through vocal cords under direct vision,  positive ETCO2 and breath sounds checked- equal and bilateral Secured at: 22 cm Tube secured with: Tape Dental Injury: Teeth and Oropharynx as per pre-operative assessment  Comments: Easy atraumatic induction and intubation with MAC 4 blade.  Dr. Gypsy Balsam verified placement of ETT.  Carlynn Herald, CRNA

## 2013-03-19 NOTE — Progress Notes (Signed)
Received report at 2.  Kathlene November, Talana Slatten [place]

## 2013-03-19 NOTE — ED Notes (Signed)
Dr. Opitz at bedside. 

## 2013-03-19 NOTE — Anesthesia Postprocedure Evaluation (Signed)
  Anesthesia Post-op Note  Patient: Fred Mendez  Procedure(s) Performed: Procedure(s): LAPAROSCOPIC CHOLECYSTECTOMY WITH INTRAOPERATIVE CHOLANGIOGRAM (N/A)  Patient Location: PACU  Anesthesia Type:General  Level of Consciousness: awake and sedated  Airway and Oxygen Therapy: Patient Spontanous Breathing  Post-op Pain: mild  Post-op Assessment: Post-op Vital signs reviewed  Post-op Vital Signs: stable  Complications: No apparent anesthesia complications

## 2013-03-19 NOTE — Progress Notes (Signed)
Admission note:   Arrival Method: Via stretcher from ED around 9:45am Mental Status: A&OX4 Telemetry: N/A  Skin: Intact.  Tubes: N/A IV: RAC NSL 03/19/13 Pain: 0/10  Family: Pt is alone. Living Situation: Lives with mother. Safety Measures: Call bell within reach.  5W Orientation: Oriented to unit and surroundings.   Pt currently NPO for lap chole later today. Consent obtained. Pt educated on diet.   Fred Mendez, [name] [place]

## 2013-03-19 NOTE — H&P (Signed)
Fred Mendez is an 37 y.o. male.   Chief Complaint: RUQ pain/ nausea HPI: 37 yo male in good health presents with a four-month history of documented gallstones.  He has had several ED visits for symptomatic gallstones, but did not have surgery for financial reasons.  Last night, he had some chicken and burritos and developed severe RUQ pain, nausea, and bloating.  He presented to the ED for evaluation.  Past Medical History  Diagnosis Date  . Gallstones     History reviewed. No pertinent past surgical history.  History reviewed. No pertinent family history. Social History: He smokes a few cigarettes each day; occasional EtOH use Allergies: No Known Allergies  Prior to Admission medications   Medication Sig Start Date End Date Taking? Authorizing Provider  ondansetron (ZOFRAN) 4 MG tablet Take 1 tablet (4 mg total) by mouth every 6 (six) hours. 03/19/13   Sunnie Nielsen, MD  oxyCODONE-acetaminophen (PERCOCET/ROXICET) 5-325 MG per tablet Take 1 tablet by mouth every 6 (six) hours as needed for pain. 03/11/13   Arman Filter, NP  oxyCODONE-acetaminophen (PERCOCET/ROXICET) 5-325 MG per tablet Take 2 tablets by mouth every 4 (four) hours as needed for pain. 03/19/13   Sunnie Nielsen, MD     Results for orders placed during the hospital encounter of 03/19/13 (from the past 48 hour(s))  LIPASE, BLOOD     Status: None   Collection Time    03/18/13 11:46 PM      Result Value Range   Lipase 42  11 - 59 U/L  COMPREHENSIVE METABOLIC PANEL     Status: Abnormal   Collection Time    03/18/13 11:46 PM      Result Value Range   Sodium 138  135 - 145 mEq/L   Potassium 3.3 (*) 3.5 - 5.1 mEq/L   Chloride 101  96 - 112 mEq/L   CO2 25  19 - 32 mEq/L   Glucose, Bld 101 (*) 70 - 99 mg/dL   BUN 14  6 - 23 mg/dL   Creatinine, Ser 1.19  0.50 - 1.35 mg/dL   Calcium 9.3  8.4 - 14.7 mg/dL   Total Protein 7.5  6.0 - 8.3 g/dL   Albumin 4.2  3.5 - 5.2 g/dL   AST 18  0 - 37 U/L   ALT 22  0 - 53 U/L   Alkaline  Phosphatase 41  39 - 117 U/L   Total Bilirubin 0.4  0.3 - 1.2 mg/dL   GFR calc non Af Amer >90  >90 mL/min   GFR calc Af Amer >90  >90 mL/min   Comment: (NOTE)     The eGFR has been calculated using the CKD EPI equation.     This calculation has not been validated in all clinical situations.     eGFR's persistently <90 mL/min signify possible Chronic Kidney     Disease.  CBC WITH DIFFERENTIAL     Status: Abnormal   Collection Time    03/18/13 11:46 PM      Result Value Range   WBC 5.9  4.0 - 10.5 K/uL   RBC 4.72  4.22 - 5.81 MIL/uL   Hemoglobin 15.1  13.0 - 17.0 g/dL   HCT 82.9  56.2 - 13.0 %   MCV 92.2  78.0 - 100.0 fL   MCH 32.0  26.0 - 34.0 pg   MCHC 34.7  30.0 - 36.0 g/dL   RDW 86.5  78.4 - 69.6 %   Platelets 269  150 -  400 K/uL   Neutrophils Relative % 44  43 - 77 %   Neutro Abs 2.6  1.7 - 7.7 K/uL   Lymphocytes Relative 49 (*) 12 - 46 %   Lymphs Abs 2.9  0.7 - 4.0 K/uL   Monocytes Relative 6  3 - 12 %   Monocytes Absolute 0.3  0.1 - 1.0 K/uL   Eosinophils Relative 1  0 - 5 %   Eosinophils Absolute 0.1  0.0 - 0.7 K/uL   Basophils Relative 0  0 - 1 %   Basophils Absolute 0.0  0.0 - 0.1 K/uL   US Abdomen Complete  03/19/2013   CLINICAL DATA:  Right upper quadrant pain.  EXAM: ULTRASOUND ABDOMEN COMPLETE  COMPARISON:  11/24/2012  FINDINGS: Gallbladder  1.2 cm mobile gallstone. Gallbladder wall is mildly thickened, measuring up to 4 mm. No pericholecystic fluid. Difficult to assess for sonographic Murphy's sign as the patient has been administered pain medication.  Common bile duct  Diameter: Normal caliber, 3 mm.  Liver  1.5 cm hyperechoic area within the liver compatible with small hemangioma. Otherwise unremarkable.  IVC  No abnormality visualized.  Pancreas  Visualized portion unremarkable.  Spleen  Size and appearance within normal limits.  Right Kidney  Length: 10.4 cm. Echogenicity within normal limits. No mass or hydronephrosis visualized.  Left Kidney  Length: 11.1 cm.  Echogenicity within normal limits. No mass or hydronephrosis visualized.  Abdominal aorta  No aneurysm visualized.  IMPRESSION: Cholelithiasis. Mild gallbladder wall thickening. This may reflect chronic cholecystitis, but recommend clinical correlation to exclude acute cholecystitis.   Electronically Signed   By: Charlett Nose M.D.   On: 03/19/2013 06:01    Review of Systems  Constitutional: Negative for weight loss.  HENT: Negative for hearing loss, ear pain, neck pain, tinnitus and ear discharge.   Eyes: Negative for blurred vision, double vision, photophobia and pain.  Respiratory: Negative for cough, sputum production and shortness of breath.   Cardiovascular: Negative for chest pain.  Gastrointestinal: Positive for nausea and abdominal pain. Negative for vomiting.  Genitourinary: Negative for dysuria, urgency, frequency and flank pain.  Musculoskeletal: Negative for myalgias, back pain, joint pain and falls.  Neurological: Negative for dizziness, tingling, sensory change, focal weakness, loss of consciousness and headaches.  Endo/Heme/Allergies: Does not bruise/bleed easily.  Psychiatric/Behavioral: Negative for depression, memory loss and substance abuse. The patient is not nervous/anxious.     Blood pressure 118/75, pulse 68, temperature 98.2 F (36.8 C), temperature source Oral, resp. rate 16, height 5\' 7"  (1.702 m), weight 170 lb (77.111 kg), SpO2 96.00%. Physical Exam  WDWN in NAD HEENT:  EOMI, sclera anicteric Neck:  No masses, no thyromegaly Lungs:  CTA bilaterally; normal respiratory effort CV:  Regular rate and rhythm; no murmurs Abd:  +bowel sounds, mildly distended; tender in RUQ Ext:  Well-perfused; no edema Skin:  Warm, dry; no sign of jaundice  Assessment/Plan Probable acute cholecystitis  Plan:  Laparoscopic cholecystectomy with intraoperative cholangiogram.  The surgical procedure has been discussed with the patient.  Potential risks, benefits, alternative  treatments, and expected outcomes have been explained.  All of the patient's questions at this time have been answered.  The likelihood of reaching the patient's treatment goal is good.  The patient understand the proposed surgical procedure and wishes to proceed.   Rogerick Baldwin K. 03/19/2013, 7:47 AM

## 2013-03-19 NOTE — Progress Notes (Signed)
Pt returned from PACU around 1615. A&OX4. Pt appears stable. VSS. Pt has 5 incisions on abdomen - 4 covered with gauze/tegaderm and 1 with skin glue/steristrip. No drainage noted. Pt denies pain. Pt now on clear liquid diet. Will monitor.  Fred Mendez, Fred Mendez

## 2013-03-19 NOTE — Progress Notes (Signed)
Chaplain offered emotional and spiritual support to pt by talking and listening. Chaplain will follow up as necessary.   03/19/13 1100  Clinical Encounter Type  Visited With Patient  Visit Type Initial;Spiritual support  Referral From Nurse  Spiritual Encounters  Spiritual Needs Emotional  Stress Factors  Patient Stress Factors Financial concerns    Rulon Abide

## 2013-03-19 NOTE — OR Nursing (Signed)
tarnspoted by c peeler nt/ bbrittany NT

## 2013-03-19 NOTE — Transfer of Care (Signed)
Immediate Anesthesia Transfer of Care Note  Patient: Fred Mendez  Procedure(s) Performed: Procedure(s): LAPAROSCOPIC CHOLECYSTECTOMY WITH INTRAOPERATIVE CHOLANGIOGRAM (N/A)  Patient Location: PACU  Anesthesia Type:General  Level of Consciousness: awake, alert  and oriented  Airway & Oxygen Therapy: Patient connected to face mask oxygen  Post-op Assessment: Report given to PACU RN  Post vital signs: stable  Complications: No apparent anesthesia complications

## 2013-03-19 NOTE — Care Management Note (Signed)
   CARE MANAGEMENT NOTE 03/19/2013  Patient:  Fred Mendez,Fred Mendez   Account Number:  [id number]  Date Initiated:  03/19/2013  Documentation initiated by:  Anselmo Reihl  Subjective/Objective Assessment:   referral for assistance with medications.     Action/Plan:   CM review of chart currently pt does not appear to need any meds other than pain meds, post op. CM is unable to assist with pain meds.   Anticipated DC Date:  03/20/2013   Anticipated DC Plan:  HOME/SELF CARE         Choice offered to / List presented to:             Status of service:  In process, will continue to follow Medicare Important Message given?   (If response is "NO", the following Medicare IM given date fields will be blank) Date Medicare IM given:   Date Additional Medicare IM given:    Discharge Disposition:  HOME/SELF CARE  Per UR Regulation:    If discussed at Long Length of Stay Meetings, dates discussed:    Comments:

## 2013-03-19 NOTE — Preoperative (Addendum)
Beta Blockers   Reason not to administer Beta Blockers:Not Applicable 

## 2013-03-19 NOTE — ED Provider Notes (Signed)
CSN: [id number]     Arrival date & time 03/18/13  2317 History   First MD Initiated Contact with Patient 03/19/13 0216     Chief Complaint  Patient presents with  . Abdominal Pain   (Consider location/radiation/quality/duration/timing/severity/associated sxs/prior Treatment) HPI History provided by patient. Diagnosed with gallstones about 4 months ago. He has had recurrent attacks of gallbladder pain and multiple ED visits for the same. He has not seen a surgeon because he does not have insurance or "money to do that".  Tonight at 8 PM after eating burritos and fried chicken developed right upper quadrant sharp pain that feels like his typical gallbladder pains. He has had severe pain with nausea and vomiting persistent. No fevers or chills. No abdominal pain otherwise. No chest pain or trouble breathing. Past Medical History  Diagnosis Date  . Gallstones    History reviewed. No pertinent past surgical history. History reviewed. No pertinent family history. History  Substance Use Topics  . Smoking status: Never Smoker   . Smokeless tobacco: Not on file  . Alcohol Use: Yes    Review of Systems  Constitutional: Negative for fever and chills.  HENT: Negative for neck pain and neck stiffness.   Eyes: Negative for pain.  Respiratory: Negative for shortness of breath.   Cardiovascular: Negative for chest pain.  Gastrointestinal: Positive for vomiting and abdominal pain.  Genitourinary: Negative for dysuria.  Musculoskeletal: Negative for back pain.  Skin: Negative for rash.  Neurological: Negative for headaches.  All other systems reviewed and are negative.    Allergies  Review of patient's allergies indicates no known allergies.  Home Medications   Current Outpatient Rx  Name  Route  Sig  Dispense  Refill  . oxyCODONE-acetaminophen (PERCOCET/ROXICET) 5-325 MG per tablet   Oral   Take 1 tablet by mouth every 6 (six) hours as needed for pain.   12 tablet   0    BP 149/79   Pulse 57  Temp(Src) 98.2 F (36.8 C) (Oral)  Resp 16  Ht 5\' 7"  (1.702 m)  Wt 170 lb (77.111 kg)  BMI 26.62 kg/m2  SpO2 98% Physical Exam  Constitutional: He is oriented to person, place, and time. He appears well-developed and well-nourished.  HENT:  Head: Normocephalic and atraumatic.  Eyes: EOM are normal. Pupils are equal, round, and reactive to light.  Neck: Neck supple.  Cardiovascular: Normal rate, regular rhythm and intact distal pulses.   Pulmonary/Chest: Effort normal and breath sounds normal. No respiratory distress.  Abdominal: Soft. Bowel sounds are normal. There is no rebound.  Tender to palpation right upper quadrant  Musculoskeletal: Normal range of motion. He exhibits no edema.  Neurological: He is alert and oriented to person, place, and time.  Skin: Skin is warm and dry.    ED Course  Procedures (including critical care time) Labs Review Labs Reviewed  COMPREHENSIVE METABOLIC PANEL - Abnormal; Notable for the following:    Potassium 3.3 (*)    Glucose, Bld 101 (*)    All other components within normal limits  CBC WITH DIFFERENTIAL - Abnormal; Notable for the following:    Lymphocytes Relative 49 (*)    All other components within normal limits  LIPASE, BLOOD   Imaging Review US Abdomen Complete  03/19/2013   CLINICAL DATA:  Right upper quadrant pain.  EXAM: ULTRASOUND ABDOMEN COMPLETE  COMPARISON:  11/24/2012  FINDINGS: Gallbladder  1.2 cm mobile gallstone. Gallbladder wall is mildly thickened, measuring up to 4 mm. No pericholecystic fluid.  Difficult to assess for sonographic Murphy's sign as the patient has been administered pain medication.  Common bile duct  Diameter: Normal caliber, 3 mm.  Liver  1.5 cm hyperechoic area within the liver compatible with small hemangioma. Otherwise unremarkable.  IVC  No abnormality visualized.  Pancreas  Visualized portion unremarkable.  Spleen  Size and appearance within normal limits.  Right Kidney  Length: 10.4 cm.  Echogenicity within normal limits. No mass or hydronephrosis visualized.  Left Kidney  Length: 11.1 cm. Echogenicity within normal limits. No mass or hydronephrosis visualized.  Abdominal aorta  No aneurysm visualized.  IMPRESSION: Cholelithiasis. Mild gallbladder wall thickening. This may reflect chronic cholecystitis, but recommend clinical correlation to exclude acute cholecystitis.   Electronically Signed   By: Charlett Nose M.D.   On: 03/19/2013 06:01   IV fentanyl and Zofran. Potassium for hypokalemia. Previous records reviewed, Patient has had 10 visits to the emergency department for right upper quadrant abdominal pain history of gallstones since May.   Discussed with general surgery Dr. Lindie Spruce - [facility] to evaluated bedside for symptomatic gallstones  MDM  Diagnosis: Symptomatic gallstones Labs reviewed as above Intermittent pain control with IV narcotics  Admit [facility]  Sunnie Nielsen, MD 03/19/13 [phone]

## 2013-03-20 MED ORDER — OXYCODONE-ACETAMINOPHEN 5-325 MG PO TABS: 1.0000 | ORAL_TABLET | ORAL | Status: DC | PRN

## 2013-03-20 MED ORDER — OXYCODONE-ACETAMINOPHEN 5-325 MG PO TABS: 1.0000 | ORAL_TABLET | Freq: Four times a day (QID) | ORAL | Status: DC | PRN

## 2013-03-20 NOTE — Progress Notes (Signed)
03/20/2013 influenza vaccine given in right deltoid at 1055. Lot#: [id number] and expired at 12/17/2013. 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

## 2013-03-20 NOTE — Discharge Summary (Signed)
Wilmon Arms. Corliss Skains, MD, [facility] Surgery  General/ Trauma Surgery  03/20/2013 12:56 PM

## 2013-03-20 NOTE — Discharge Summary (Signed)
Physician Discharge Summary  Patient ID: Fred Mendez MRN: [id number] DOB/AGE: 11-01-1975 37 y.o.  Admit date: 03/19/2013 Discharge date: 03/20/2013  Admitting Diagnosis: Symptomatic gallstones Biliary colic Acute cholecystitis  Discharge Diagnosis Patient Active Problem List   Diagnosis Date Noted  . Cholecystitis, acute with cholelithiasis 03/19/2013    Consultants None  Imaging: Dg Cholangiogram Operative  03/19/2013   CLINICAL DATA:  Cholecystectomy  EXAM: INTRAOPERATIVE CHOLANGIOGRAM  TECHNIQUE: Cholangiographic images from the C-arm fluoroscopic device were submitted for interpretation post-operatively. Please see the procedural report for the amount of contrast and the fluoroscopy time utilized.  COMPARISON:  None.  FINDINGS: No persistent filling defects in the common duct. Intrahepatic ducts are incompletely visualized, appearing decompressed centrally. Contrast passes into the duodenum.  : Negative for retained common duct stone.   Electronically Signed   By: Oley Balm M.D.   On: 03/19/2013 15:18   US Abdomen Complete  03/19/2013   CLINICAL DATA:  Right upper quadrant pain.  EXAM: ULTRASOUND ABDOMEN COMPLETE  COMPARISON:  11/24/2012  FINDINGS: Gallbladder  1.2 cm mobile gallstone. Gallbladder wall is mildly thickened, measuring up to 4 mm. No pericholecystic fluid. Difficult to assess for sonographic Murphy's sign as the patient has been administered pain medication.  Common bile duct  Diameter: Normal caliber, 3 mm.  Liver  1.5 cm hyperechoic area within the liver compatible with small hemangioma. Otherwise unremarkable.  IVC  No abnormality visualized.  Pancreas  Visualized portion unremarkable.  Spleen  Size and appearance within normal limits.  Right Kidney  Length: 10.4 cm. Echogenicity within normal limits. No mass or hydronephrosis visualized.  Left Kidney  Length: 11.1 cm. Echogenicity within normal limits. No mass or hydronephrosis visualized.  Abdominal aorta  No  aneurysm visualized.  IMPRESSION: Cholelithiasis. Mild gallbladder wall thickening. This may reflect chronic cholecystitis, but recommend clinical correlation to exclude acute cholecystitis.   Electronically Signed   By: Charlett Nose M.D.   On: 03/19/2013 06:01    Procedures Dr. Corliss Skains (03/19/13) - Laparoscopic Cholecystectomy with [facility]   Hospital Course:  37 yo male in good health presents with a four-month history of documented gallstones. He has had several ED visits for symptomatic gallstones, but did not have surgery for financial reasons. Last night, he had some chicken and burritos and developed severe RUQ pain, nausea, and bloating. He presented to [facility] for evaluation.  Workup showed cholelithiasis and mild GB wall thickening.  Patient was admitted and underwent procedure listed above.  Tolerated procedure well and was transferred to the floor.  Diet was advanced as tolerated.  On POD #1, the patient was voiding well, tolerating diet, ambulating well, pain well controlled, vital signs stable, incisions c/d/i and felt stable for discharge home.  Patient will follow up in our office in 3 weeks and knows to call with questions or concerns.  Physical Exam: General:  Alert, NAD, pleasant, comfortable Abd:  Soft, ND, mild tenderness, incisions C/D/I    Medication List         ondansetron 4 MG tablet  Commonly known as:  ZOFRAN  Take 1 tablet (4 mg total) by mouth every 6 (six) hours.     oxyCODONE-acetaminophen 5-325 MG per tablet  Commonly known as:  PERCOCET/ROXICET  Take 2 tablets by mouth every 4 (four) hours as needed for pain.     oxyCODONE-acetaminophen 5-325 MG per tablet  Commonly known as:  PERCOCET/ROXICET  Take 1-2 tablets by mouth every 4 (four) hours as needed.  Follow-up Information   Follow up with [facility] On 04/16/2013. (Your appointment is at 1:30pm, please arrive at least 30 minutes before your appointment to complete your check in  paperwork.  If you are unable to arrive 30 minutes prior to your appointment time we may have to cancel or reschedule you.)    Contact information:   [address]   [place] [address] [phone]       Schedule an appointment as soon as possible for a visit with [facility]    .   Contact information:   [address] [place] [address]       Signed: Aris [facility] Surgery [phone]  03/20/2013, 7:52 AM

## 2013-03-20 NOTE — Progress Notes (Signed)
03/20/2013 pneumococcal vaccine given in left deltoid at 1046. Lot #: [id number] and Expired April 09, 2014. Lovie Macadamia RN

## 2013-03-22 ENCOUNTER — Encounter (HOSPITAL_COMMUNITY): Payer: Self-pay | Admitting: Surgery

## 2013-04-16 ENCOUNTER — Encounter (INDEPENDENT_AMBULATORY_CARE_PROVIDER_SITE_OTHER): Payer: Self-pay

## 2013-08-20 ENCOUNTER — Encounter (HOSPITAL_COMMUNITY): Payer: Self-pay | Admitting: Emergency Medicine

## 2013-08-20 ENCOUNTER — Emergency Department (HOSPITAL_COMMUNITY)
Admission: EM | Admit: 2013-08-20 | Discharge: 2013-08-21 | Disposition: A | Discharge status: Home / Self Care | Payer: Self-pay | Attending: Emergency Medicine | Admitting: Emergency Medicine

## 2013-08-20 DIAGNOSIS — K089 Disorder of teeth and supporting structures, unspecified: Secondary | ICD-10-CM | POA: Insufficient documentation

## 2013-08-20 DIAGNOSIS — K0889 Other specified disorders of teeth and supporting structures: Secondary | ICD-10-CM

## 2013-08-20 DIAGNOSIS — Z87891 Personal history of nicotine dependence: Secondary | ICD-10-CM | POA: Insufficient documentation

## 2013-08-20 DIAGNOSIS — K047 Periapical abscess without sinus: Secondary | ICD-10-CM | POA: Insufficient documentation

## 2013-08-20 NOTE — ED Notes (Signed)
Pt in c/o dental pain since November, pain increased tonight

## 2013-08-21 MED ORDER — TRIAMCINOLONE ACETONIDE 40 MG/ML IJ SUSP
40.0000 mg | Freq: Once | INTRAMUSCULAR | Status: DC
  Filled 2013-08-21: qty 1

## 2013-08-21 MED ORDER — NAPROXEN 500 MG PO TABS: 500.0000 mg | ORAL_TABLET | Freq: Two times a day (BID) | ORAL | Status: AC

## 2013-08-21 MED ORDER — PENICILLIN V POTASSIUM 500 MG PO TABS: 500.0000 mg | ORAL_TABLET | Freq: Four times a day (QID) | ORAL | Status: AC

## 2013-08-21 NOTE — Discharge Instructions (Signed)
°  Dental Abscess °A dental abscess is a collection of infected fluid (pus) from a bacterial infection in the inner part of the tooth (pulp). It usually occurs at the end of the tooth's root.  °CAUSES  °· Severe tooth decay. °· Trauma to the tooth that allows bacteria to enter into the pulp, such as a broken or chipped tooth. °SYMPTOMS  °· Severe pain in and around the infected tooth. °· Swelling and redness around the abscessed tooth or in the mouth or face. °· Tenderness. °· Pus drainage. °· Bad breath. °· Bitter taste in the mouth. °· Difficulty swallowing. °· Difficulty opening the mouth. °· Nausea. °· Vomiting. °· Chills. °· Swollen neck glands. °DIAGNOSIS  °· A medical and dental history will be taken. °· An examination will be performed by tapping on the abscessed tooth. °· X-rays may be taken of the tooth to identify the abscess. °TREATMENT °The goal of treatment is to eliminate the infection. You may be prescribed antibiotic medicine to stop the infection from spreading. A root canal may be performed to save the tooth. If the tooth cannot be saved, it may be pulled (extracted) and the abscess may be drained.  °HOME CARE INSTRUCTIONS °· Only take over-the-counter or prescription medicines for pain, fever, or discomfort as directed by your caregiver. °· Rinse your mouth (gargle) often with salt water (¼ tsp salt in 8 oz [250 ml] of warm water) to relieve pain or swelling. °· Do not drive after taking pain medicine (narcotics). °· Do not apply heat to the outside of your face. °· Return to your dentist for further treatment as directed. °SEEK MEDICAL CARE IF: °· Your pain is not helped by medicine. °· Your pain is getting worse instead of better. °SEEK IMMEDIATE MEDICAL CARE IF: °· You have a fever or persistent symptoms for more than 2 3 days. °· You have a fever and your symptoms suddenly get worse. °· You have chills or a very bad headache. °· You have problems breathing or swallowing. °· You have trouble  opening your mouth. °· You have swelling in the neck or around the eye. °Document Released: 06/06/2005 Document Revised: 02/29/2012 Document Reviewed: 09/14/2010 °ExitCare® Patient Information ©2014 [employer]. ° ° °

## 2013-08-21 NOTE — ED Provider Notes (Signed)
CSN: [id number]     Arrival date & time 08/20/13  2351 History   First MD Initiated Contact with Patient 08/21/13 0029     Chief Complaint  Patient presents with  . Dental Pain     (Consider location/radiation/quality/duration/timing/severity/associated sxs/prior Treatment) Patient is a 38 y.o. male presenting with tooth pain.  Dental Pain  38 yo male presents with Right sided tooth pain x 1 week. Patient states pain has gradually worsened and is now rated at 10/10 sharp constant pain that is worse with chewing. Patient denies any fever/chills, dizziness, chest pain, SOB, N/V. Patient denies hx of any similar episodes. Patient denies any pertinent PMH.   Past Medical History  Diagnosis Date  . Gallstones    Past Surgical History  Procedure Laterality Date  . Cholecystectomy N/A 03/19/2013    Procedure: LAPAROSCOPIC CHOLECYSTECTOMY WITH INTRAOPERATIVE CHOLANGIOGRAM;  Surgeon: Wilmon Arms. Corliss Skains, MD;  Location: [facility] OR;  Service: General;  Laterality: N/A;   History reviewed. No pertinent family history. History  Substance Use Topics  . Smoking status: Former Smoker -- 0.25 packs/day for 18 years    Types: Cigarettes  . Smokeless tobacco: Never Used  . Alcohol Use: Yes     Comment: "once in a blue moon"    Review of Systems  All other systems reviewed and are negative.      Allergies  Review of patient's allergies indicates no known allergies.  Home Medications   Current Outpatient Rx  Name  Route  Sig  Dispense  Refill  . acetaminophen (TYLENOL) 325 MG tablet   Oral   Take 650 mg by mouth every 6 (six) hours as needed for mild pain.         . naproxen (NAPROSYN) 500 MG tablet   Oral   Take 1 tablet (500 mg total) by mouth 2 (two) times daily with a meal.   30 tablet   0   . penicillin v potassium (VEETID) 500 MG tablet   Oral   Take 1 tablet (500 mg total) by mouth 4 (four) times daily.   28 tablet   0    BP 149/83  Pulse 65  Temp(Src) 98.3 F (36.8 C)  (Oral)  Resp 18  SpO2 99% Physical Exam  Nursing note and vitals reviewed. Constitutional: He is oriented to person, place, and time. He appears well-developed and well-nourished. No distress.  HENT:  Head: Normocephalic and atraumatic.  Right Ear: Tympanic membrane and ear canal normal.  Left Ear: Tympanic membrane and ear canal normal.  Nose: Nose normal. Right sinus exhibits no maxillary sinus tenderness and no frontal sinus tenderness. Left sinus exhibits no maxillary sinus tenderness and no frontal sinus tenderness.  Mouth/Throat: Uvula is midline, oropharynx is clear and moist and mucous membranes are normal. No trismus in the jaw. Dental abscesses present. No uvula swelling. No oropharyngeal exudate, posterior oropharyngeal edema or posterior oropharyngeal erythema.    Alveolar abscess at lateral aspect of LEFT lower molar as depicted above in diagram. Affected tooth tender to palpation. Patient exquisitely tender over abscess area. Fluctuant with white/yellow head.    Eyes: Conjunctivae are normal. Right eye exhibits no discharge. Left eye exhibits no discharge. No scleral icterus.  Neck: Phonation normal. Neck supple. No JVD present. No rigidity. No tracheal deviation, no edema and no erythema present.  Cardiovascular: Normal rate and regular rhythm.  Exam reveals no gallop and no friction rub.   No murmur heard. Pulmonary/Chest: Effort normal. No stridor. No respiratory distress. He  has no wheezes. He has no rhonchi. He has no rales.  Musculoskeletal: He exhibits no edema.  Lymphadenopathy:    He has no cervical adenopathy.  Neurological: He is alert and oriented to person, place, and time.  Skin: Skin is warm and dry. He is not diaphoretic.  Psychiatric: He has a normal mood and affect. His behavior is normal.    ED Course  INCISION AND DRAINAGE Date/Time: 08/21/2013 3:30 AM Performed by: Rudene AndaLACKEY, Erving Sassano, Mendez Authorized by: Rudene AndaLACKEY, Madox Corkins, Mendez Consent: Verbal consent  obtained. Risks and benefits: risks, benefits and alternatives were discussed Consent given by: patient Patient understanding: patient states understanding of the procedure being performed Patient identity confirmed: verbally with patient Type: abscess Body area: mouth Location details: alveolar process Anesthesia: local infiltration Local anesthetic: bupivacaine 0.5% with epinephrine Anesthetic total: 1 ml Scalpel size: 11 Complexity: simple Drainage: purulent Drainage amount: 1 ml. Packing material: none Patient tolerance: Patient tolerated the procedure well with no immediate complications.   (including critical care time) Labs Review Labs Reviewed - No data to display Imaging Review No results found.   EKG Interpretation None      MDM   Final diagnoses:  Periapical abscess  Pain, dental   Patient afebrile. Patient in NAD.  No evidence of extending infection into neck. No evidence of ludwigs angina, throat swelling, or airway compromise.   Discussed exam findings with patient. Plan to start patient on antibiotics and have patient follow up with dentist in 24 -48 hours.  Recommend return to ED should symptoms worsen. Patient agrees with plan. Discharged in good condition.   Meds given in ED:  Medications - No data to display  Discharge Medication List as of 08/21/2013  2:41 AM    START taking these medications   Details  naproxen (NAPROSYN) 500 MG tablet Take 1 tablet (500 mg total) by mouth 2 (two) times daily with a meal., Starting 08/21/2013, Until Discontinued, Print    penicillin v potassium (VEETID) 500 MG tablet Take 1 tablet (500 mg total) by mouth 4 (four) times daily., Starting 08/21/2013, Last dose on Wed 08/28/13, Print             Allen NorrisJacob Mendez East Al[place], [place] JerseyPA-C 08/21/13 [phone]

## 2013-08-21 NOTE — ED Provider Notes (Signed)
Medical screening examination/treatment/procedure(s) were performed by non-physician practitioner and as supervising physician I was immediately available for consultation/collaboration.   EKG Interpretation None        Junius ArgyleForrest S Pricsilla Lindvall, MD 08/21/13 2048

## 2014-04-30 IMAGING — US US ABDOMEN COMPLETE
1 series · 13 of 25 positions shown · non-contrast
Comparison: None

CLINICAL DATA: Right upper quadrant abdominal pain.

ABDOMINAL ULTRASOUND COMPLETE

[Series 1: us abdomen complete · 0.25mm/px · 13 of 111 slices shown]
[im 1/111]
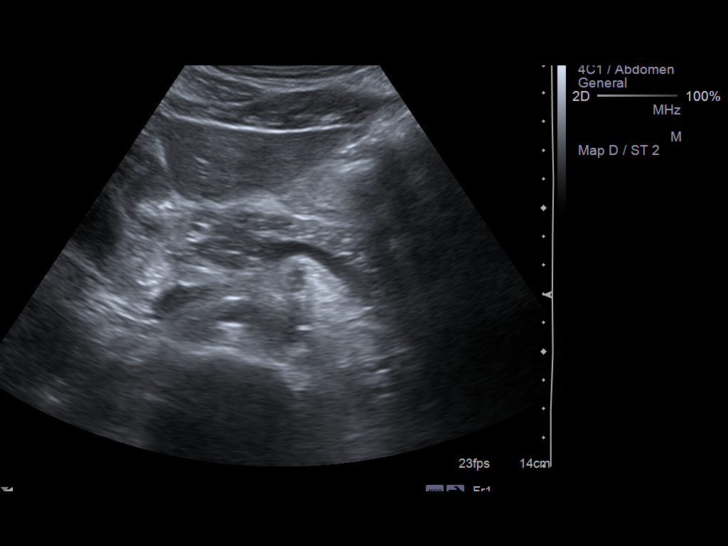
[im 10/111]
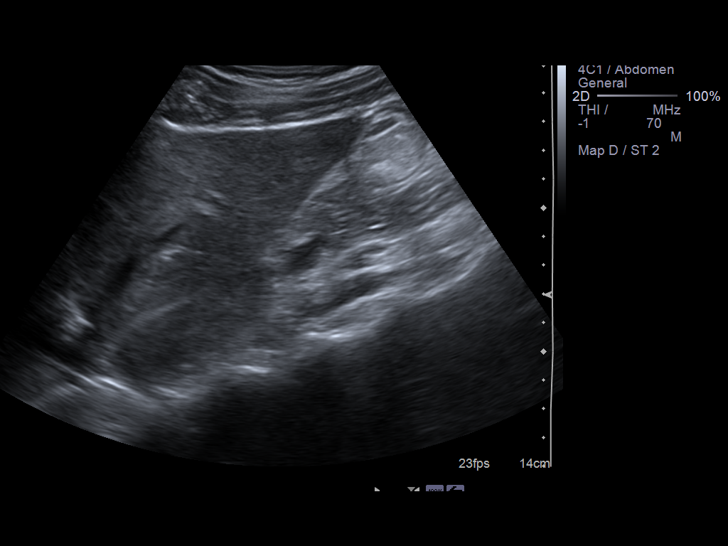
[im 19/111]
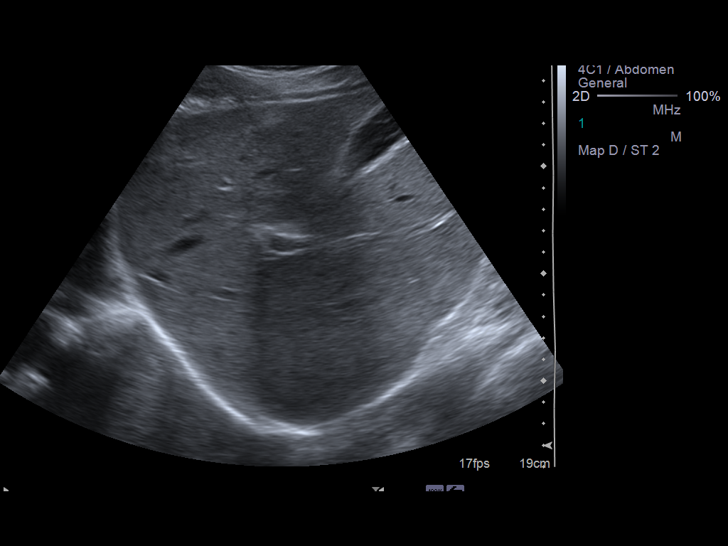
[im 28/111]
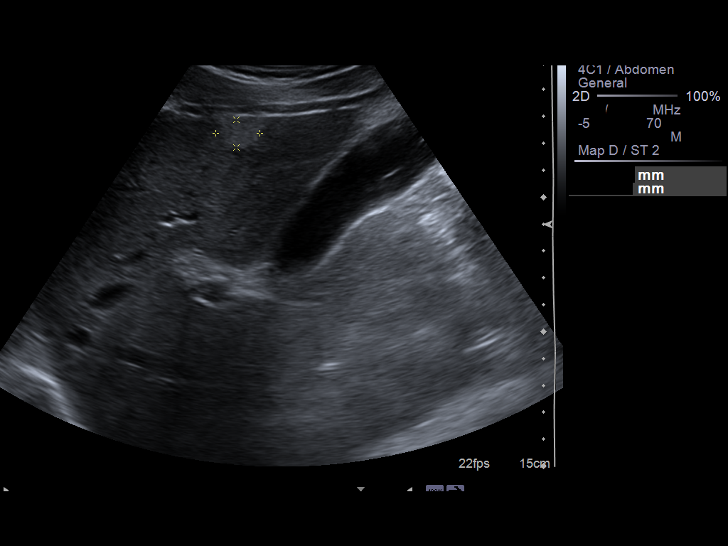
[im 37/111]
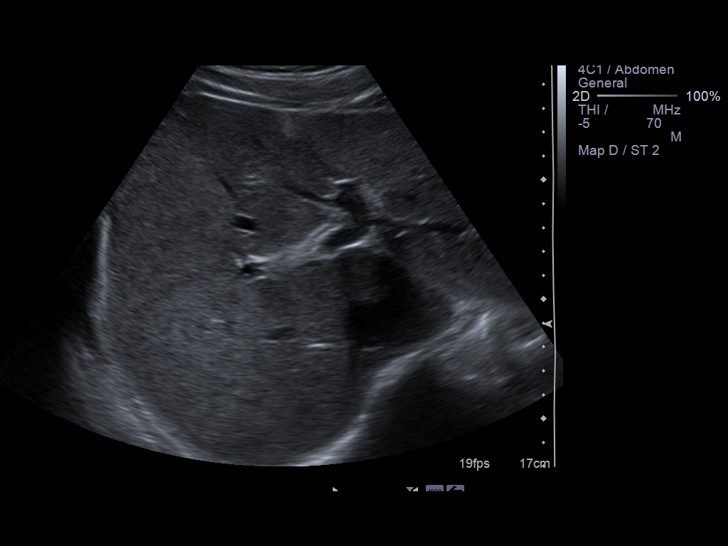
[im 46/111]
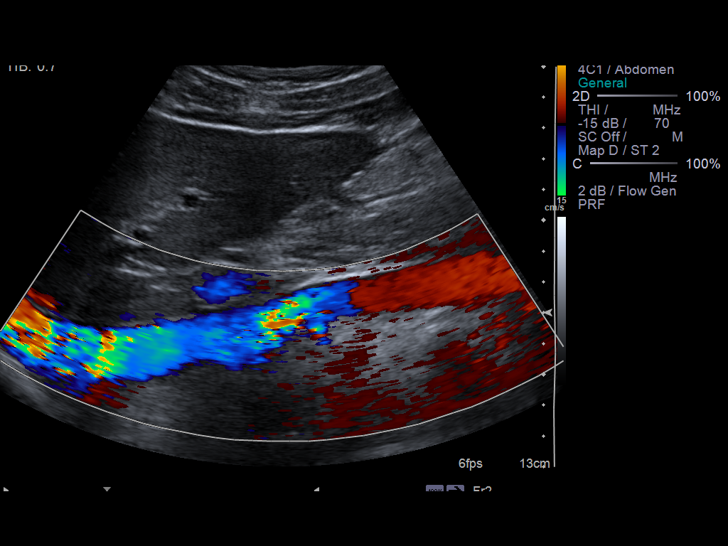
[im 56/111]
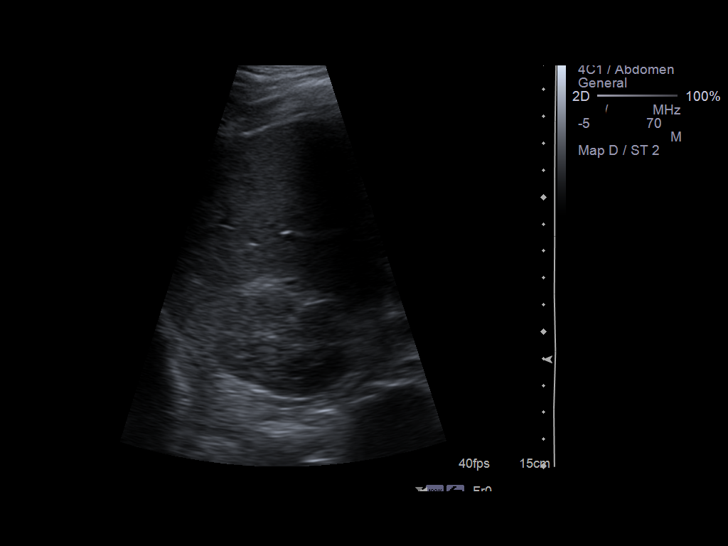
[im 65/111]
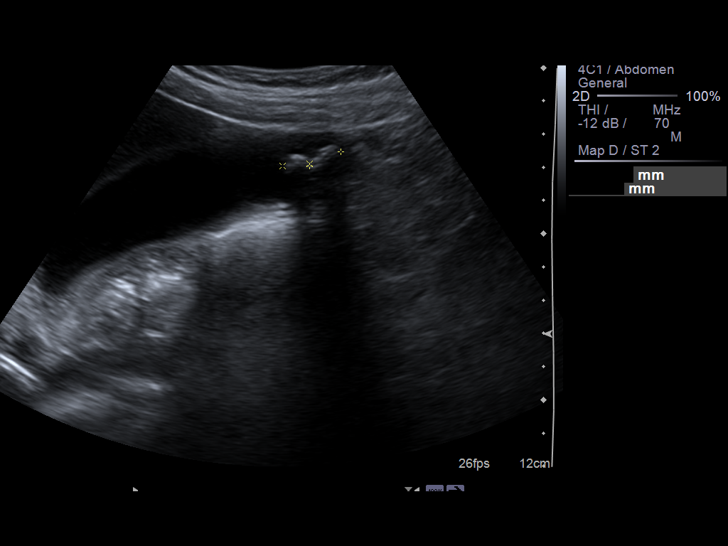
[im 74/111]
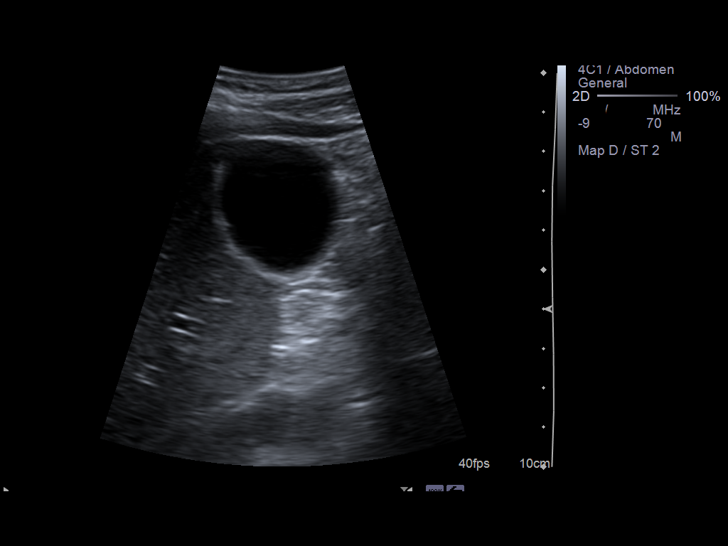
[im 83/111]
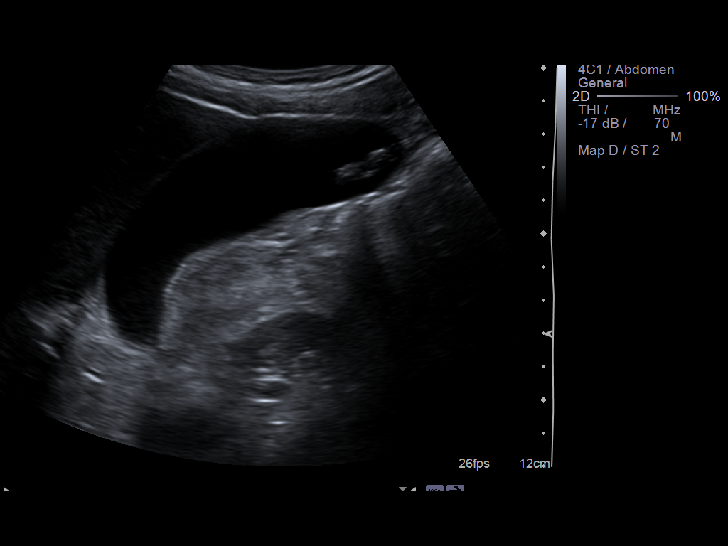
[im 92/111]
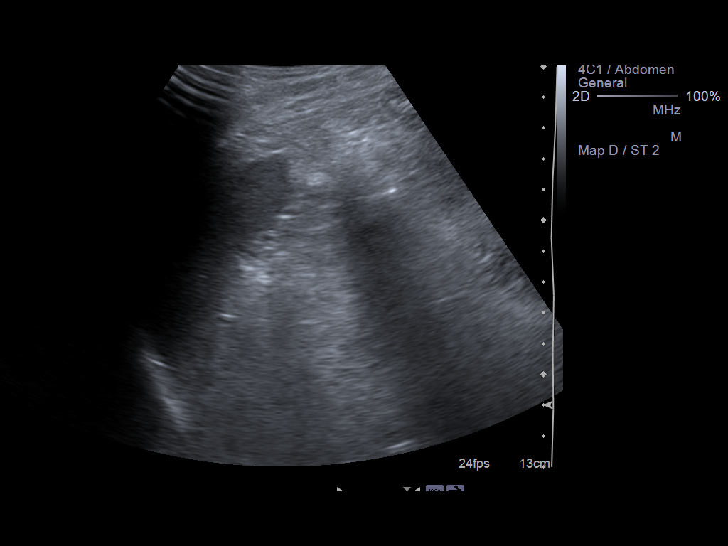
[im 101/111]
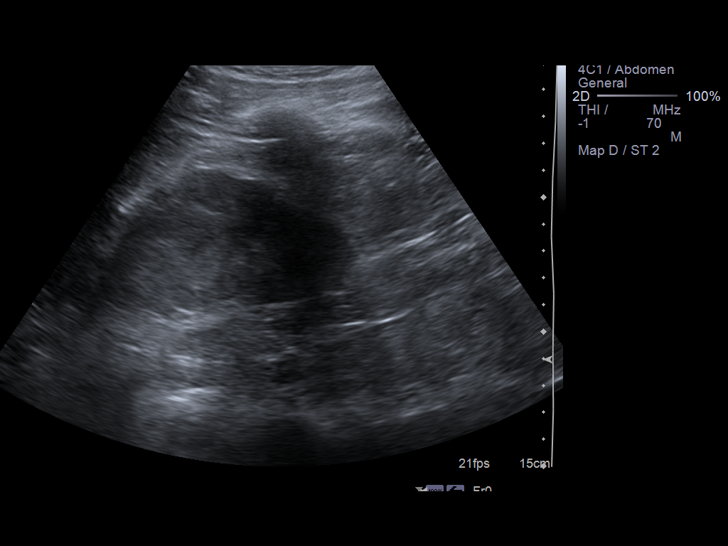
[im 111/111]
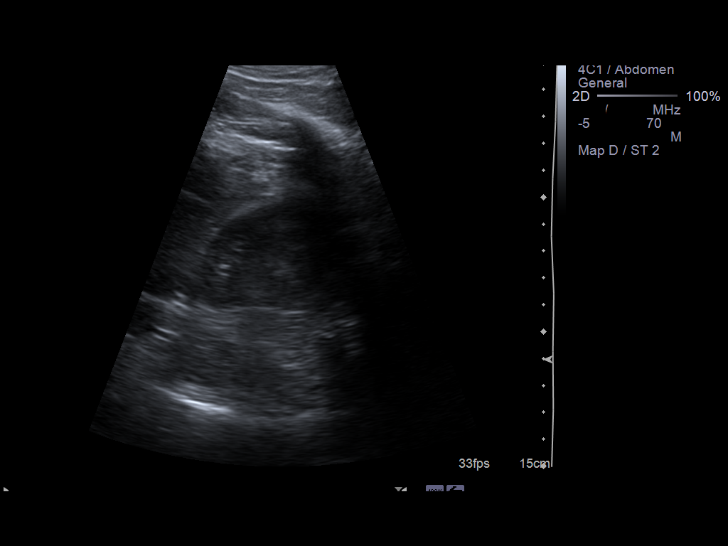

[13 of 25 positions shown; findings below may reference images not displayed]

FINDINGS: Gallbladder:  At least two stones are visualized within the
gallbladder, measuring 1.0 cm and 0.8 cm in size.  These are seen
at the gallbladder fundus.  The gallbladder wall remains normal in
thickness; no pericholecystic fluid is seen.  No ultrasonographic
Murphy's sign is elicited.

Common Bile Duct:  0.4 cm in diameter; within normal limits in
caliber.

Liver:  Normal parenchymal echogenicity and echotexture; a small
1.7 x 1.5 x 1.0 cm mildly hyperechoic focus within the right
hepatic lobe is most compatible with a hemangioma.  Limited Doppler
evaluation demonstrates normal blood flow within the liver.

IVC:  Unremarkable in appearance.

Pancreas:  Although the pancreas is difficult to visualize in its
entirety due to overlying bowel gas, no focal pancreatic
abnormality is identified.

Spleen:  7.8 cm in length; within normal limits in size and
echotexture.

Right kidney:  10.9 cm in length; normal in size, configuration and
parenchymal echogenicity.  No evidence of mass or hydronephrosis.

Left kidney:  10.2 cm in length; normal in size, configuration and
parenchymal echogenicity.  No evidence of mass or hydronephrosis.

Abdominal Aorta:  Normal in caliber; no aneurysm identified.
IMPRESSION: 1.  Cholelithiasis; gallbladder otherwise unremarkable in
appearance.  No evidence for obstruction or cholecystitis.
2.  Small hepatic hemangioma noted.

## 2014-08-23 IMAGING — US US ABDOMEN COMPLETE
1 series · 14 of 25 positions shown · non-contrast
Comparison: 11/24/2012

CLINICAL DATA: Right upper quadrant pain.

EXAM:
ULTRASOUND ABDOMEN COMPLETE

[Series 1: us abdomen complete · 0.18mm/px · 14 of 106 slices shown]
[im 1/106]
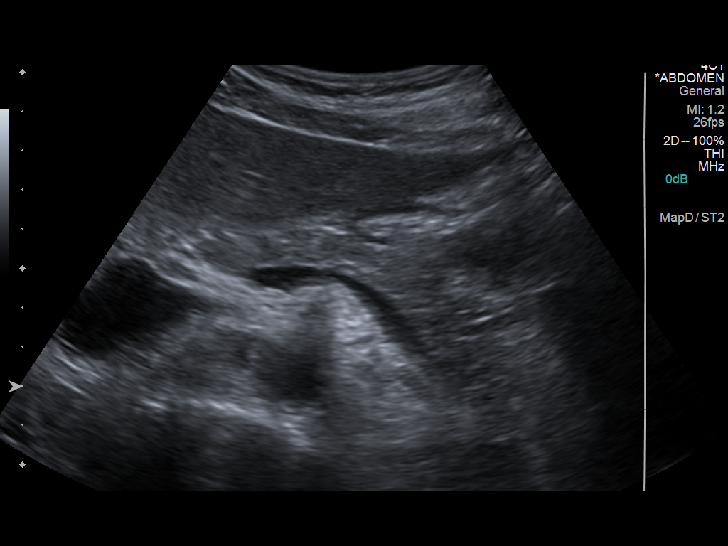
[im 9/106]
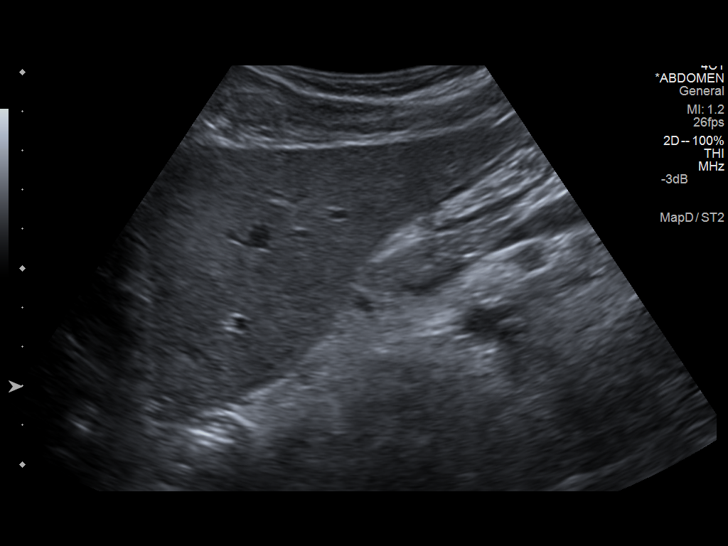
[im 18/106]
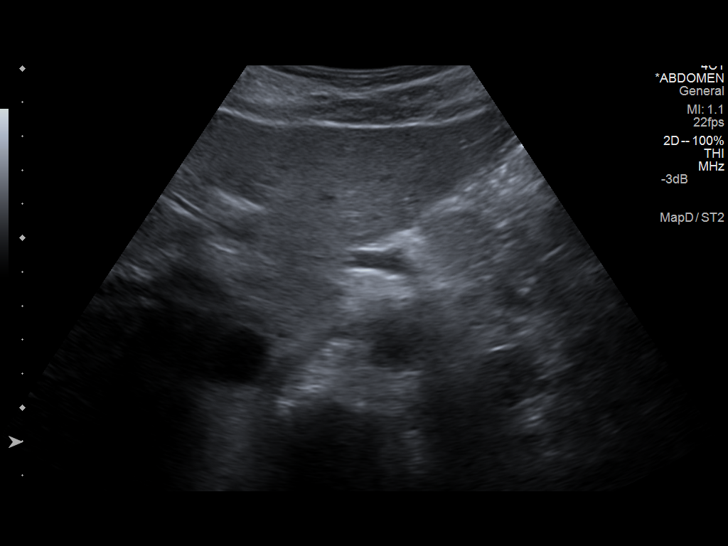
[im 27/106]
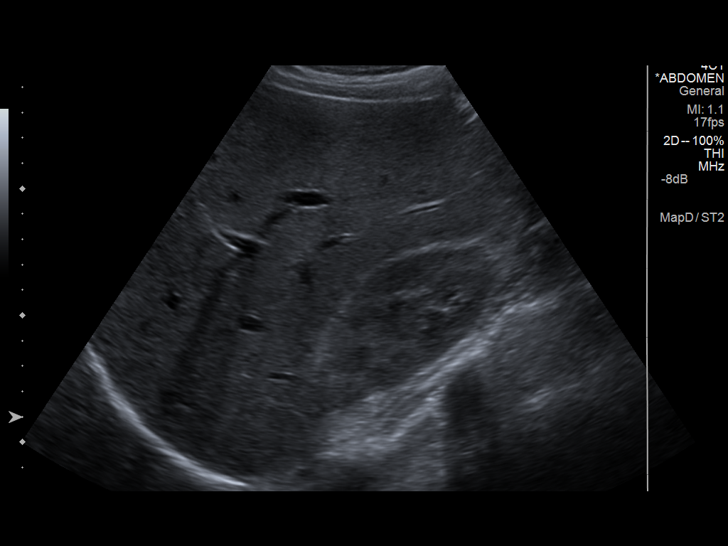
[im 36/106]
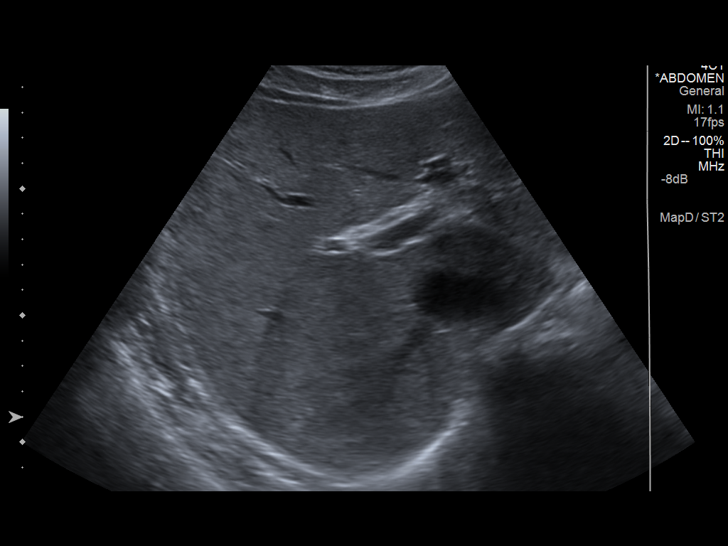
[im 40/106]
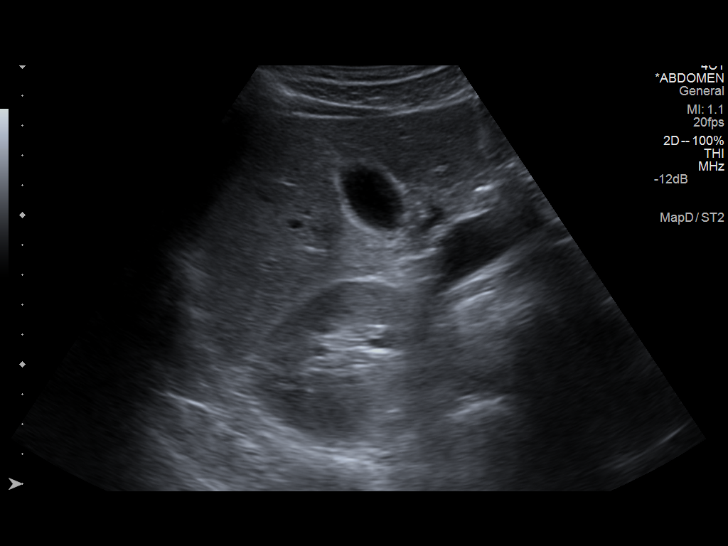
[im 49/106]
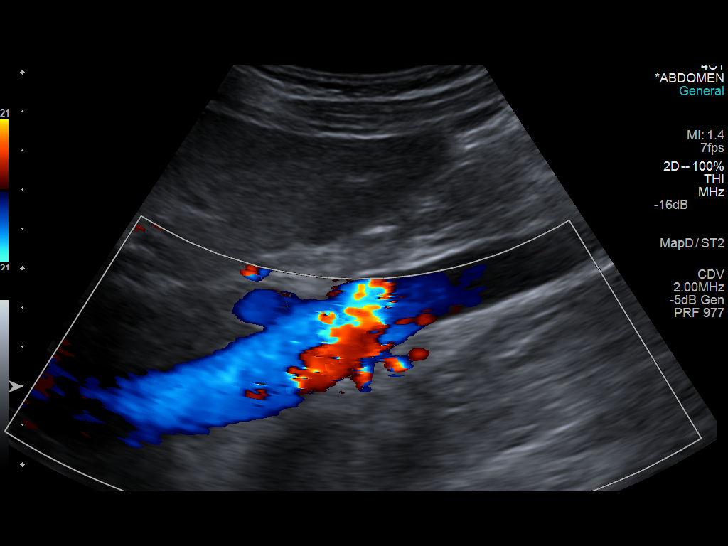
[im 57/106]
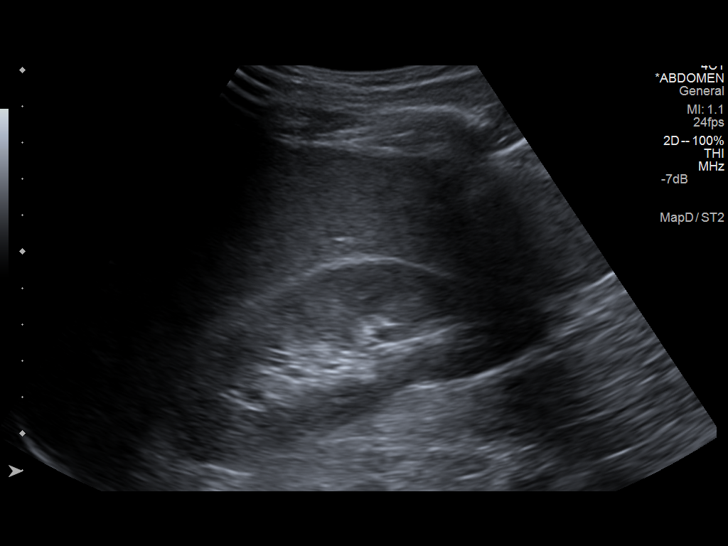
[im 66/106]
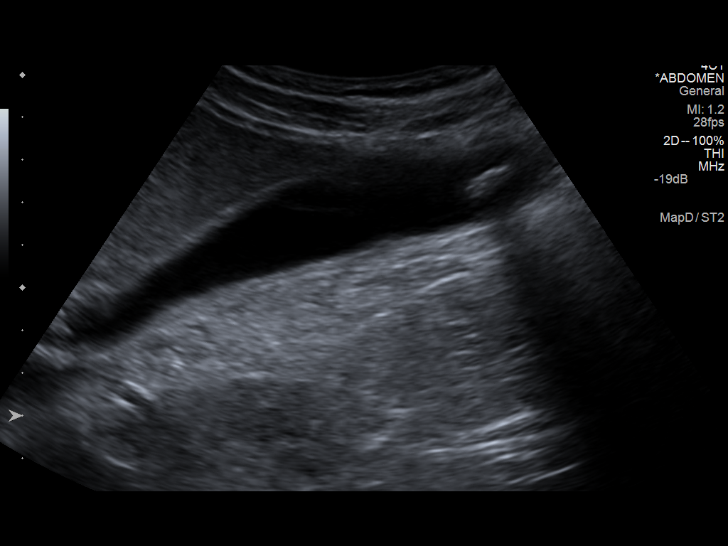
[im 71/106]
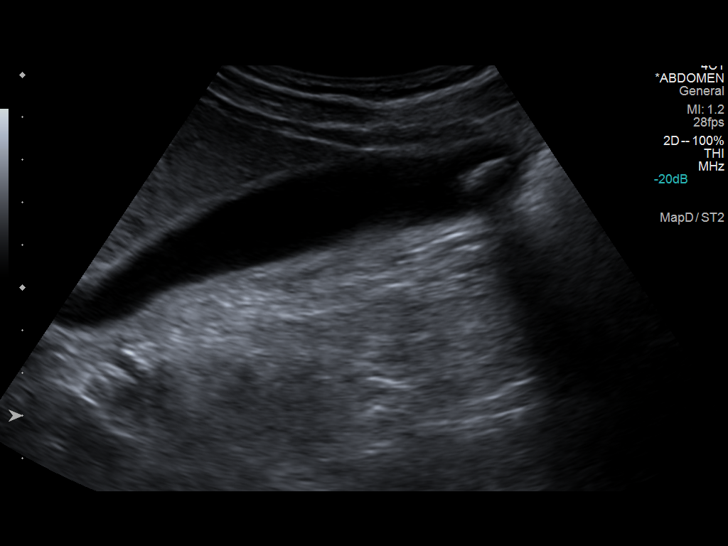
[im 79/106]
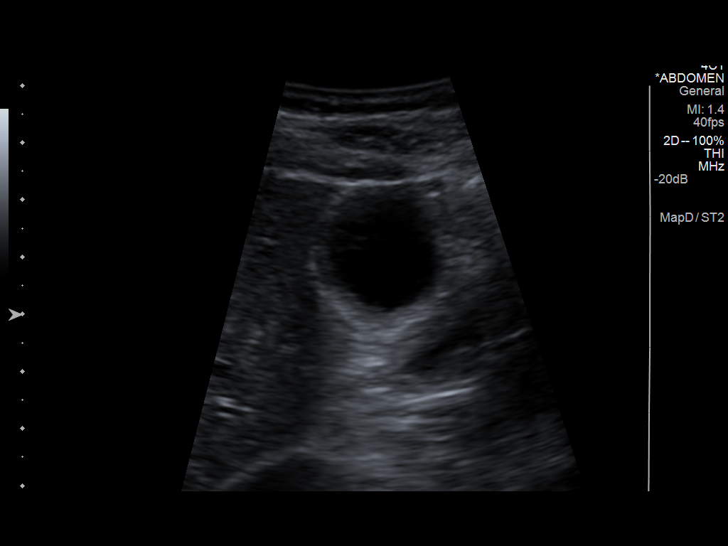
[im 88/106]
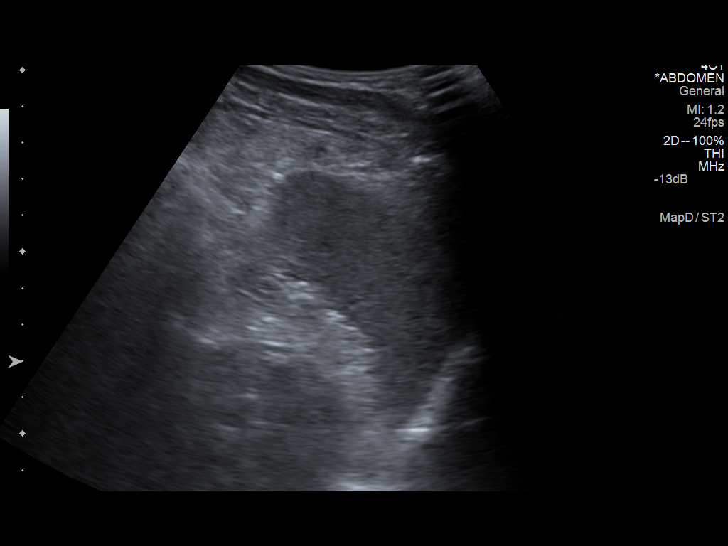
[im 97/106]
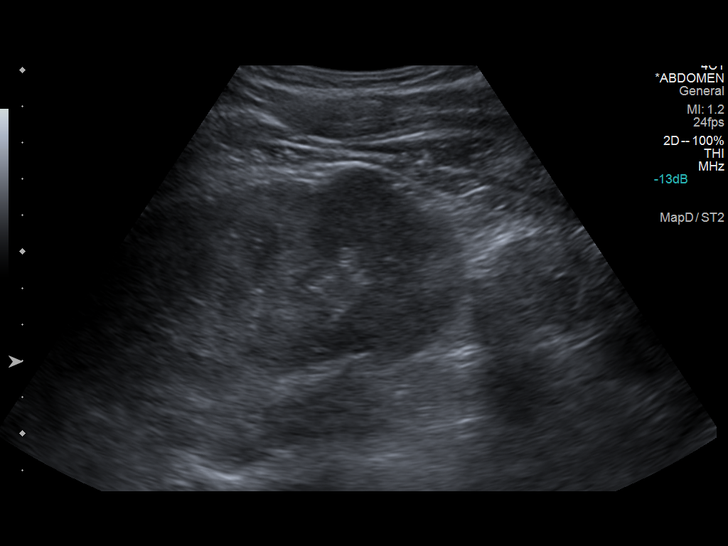
[im 106/106]
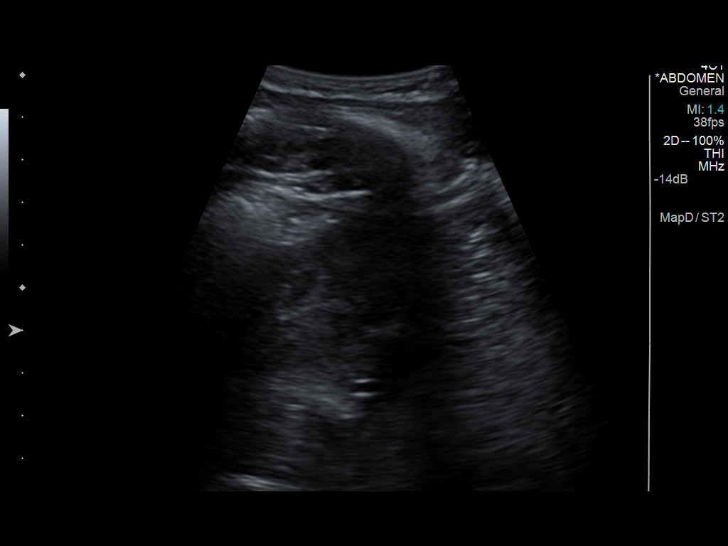

[14 of 25 positions shown; findings below may reference images not displayed]

FINDINGS: Gallbladder

1.2 cm mobile gallstone. Gallbladder wall is mildly thickened,
measuring up to 4 mm. No pericholecystic fluid. Difficult to assess
for sonographic Murphy's sign as the patient has been administered
pain medication.

Common bile duct

Diameter: Normal caliber, 3 mm.

Liver

1.5 cm hyperechoic area within the liver compatible with small
hemangioma. Otherwise unremarkable.

IVC

No abnormality visualized.

Pancreas

Visualized portion unremarkable.

Spleen

Size and appearance within normal limits.

Right Kidney

Length: 10.4 cm. Echogenicity within normal limits. No mass or
hydronephrosis visualized.

Left Kidney

Length: 11.1 cm. Echogenicity within normal limits. No mass or
hydronephrosis visualized.

Abdominal aorta

No aneurysm visualized.
IMPRESSION: Cholelithiasis. Mild gallbladder wall thickening. This may reflect
chronic cholecystitis, but recommend clinical correlation to exclude
acute cholecystitis.

## 2014-08-23 IMAGING — RF DG CHOLANGIOGRAM OPERATIVE
1 series · 5 of 5 positions shown · non-contrast
Comparison: None.

CLINICAL DATA: Cholecystectomy

EXAM:
INTRAOPERATIVE CHOLANGIOGRAM
TECHNIQUE: Cholangiographic images from the C-arm fluoroscopic device were
submitted for interpretation post-operatively. Please see the
procedural report for the amount of contrast and the fluoroscopy
time utilized.

[Series 1: run · 2 acquisitions, 5 frames shown]
[im 1/2]
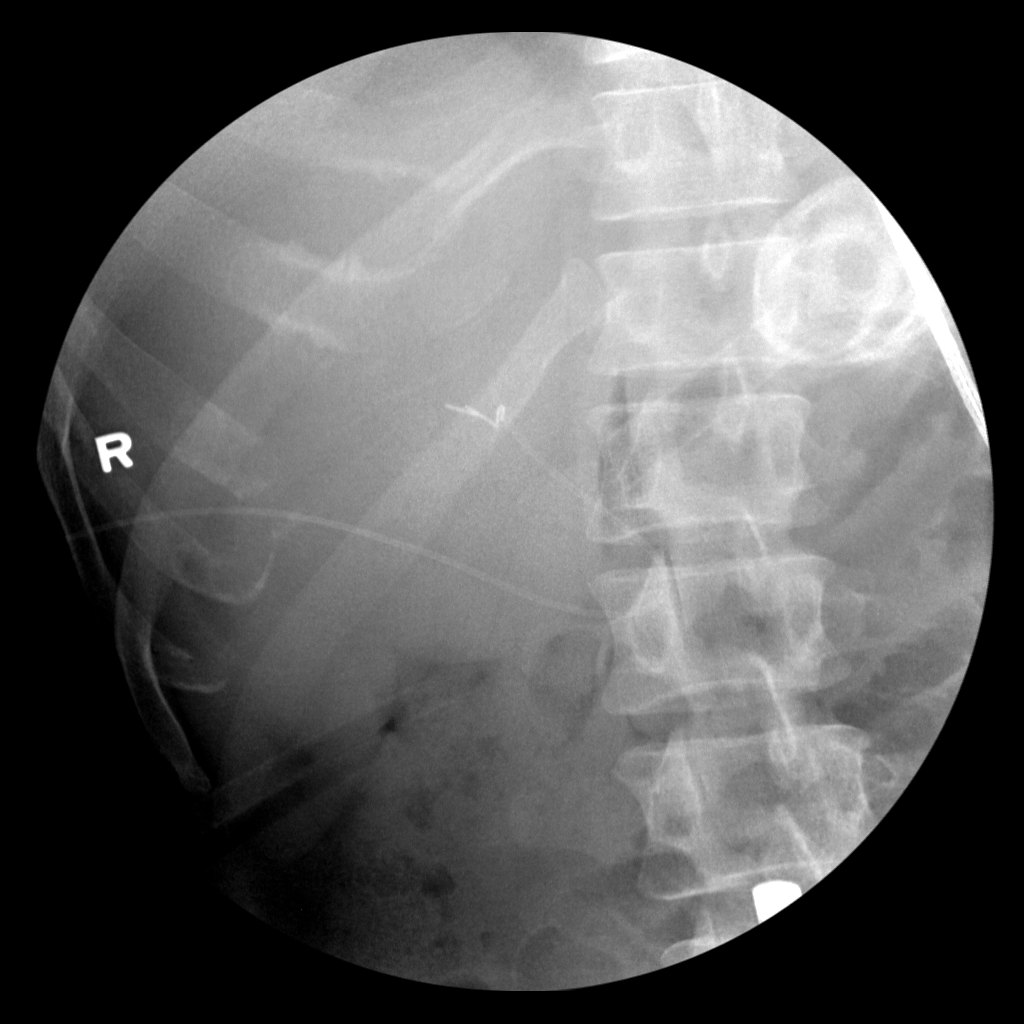
[im 1/2]
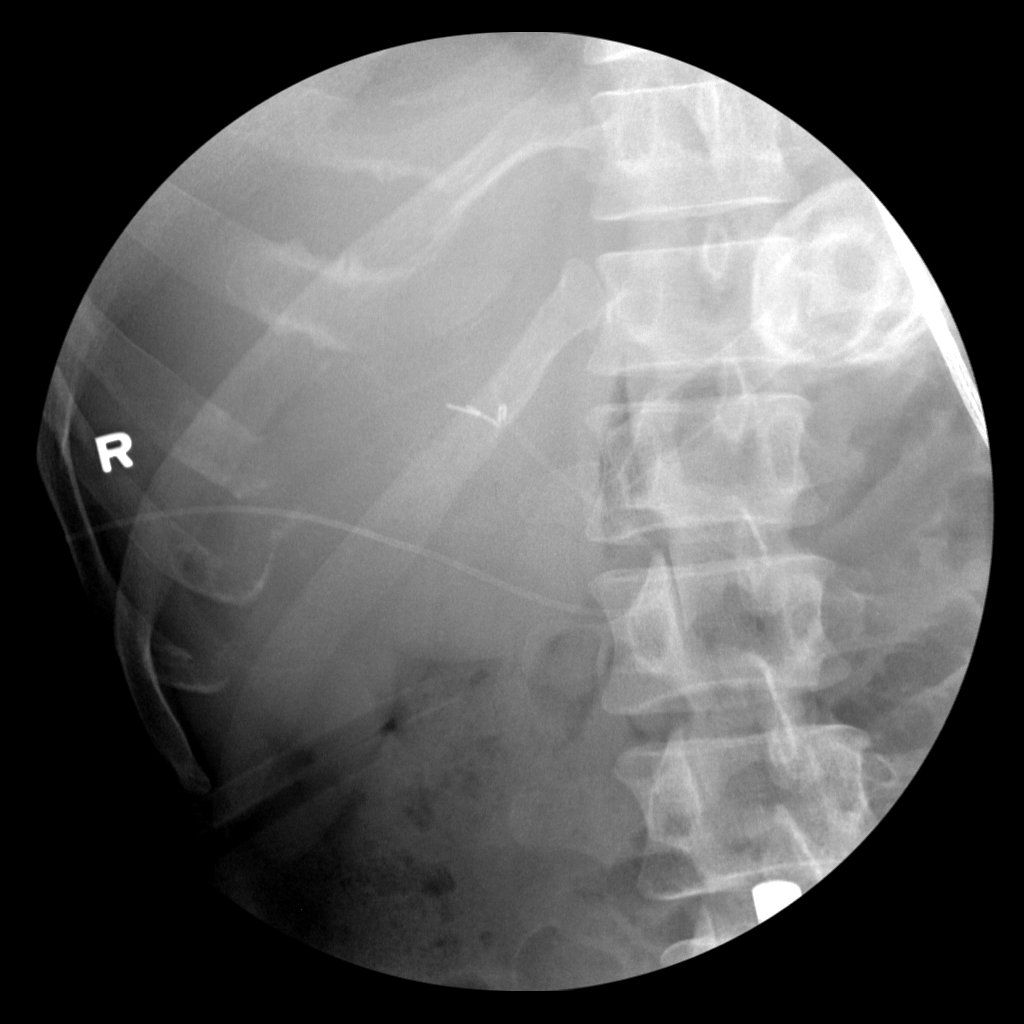
[im 1/2]
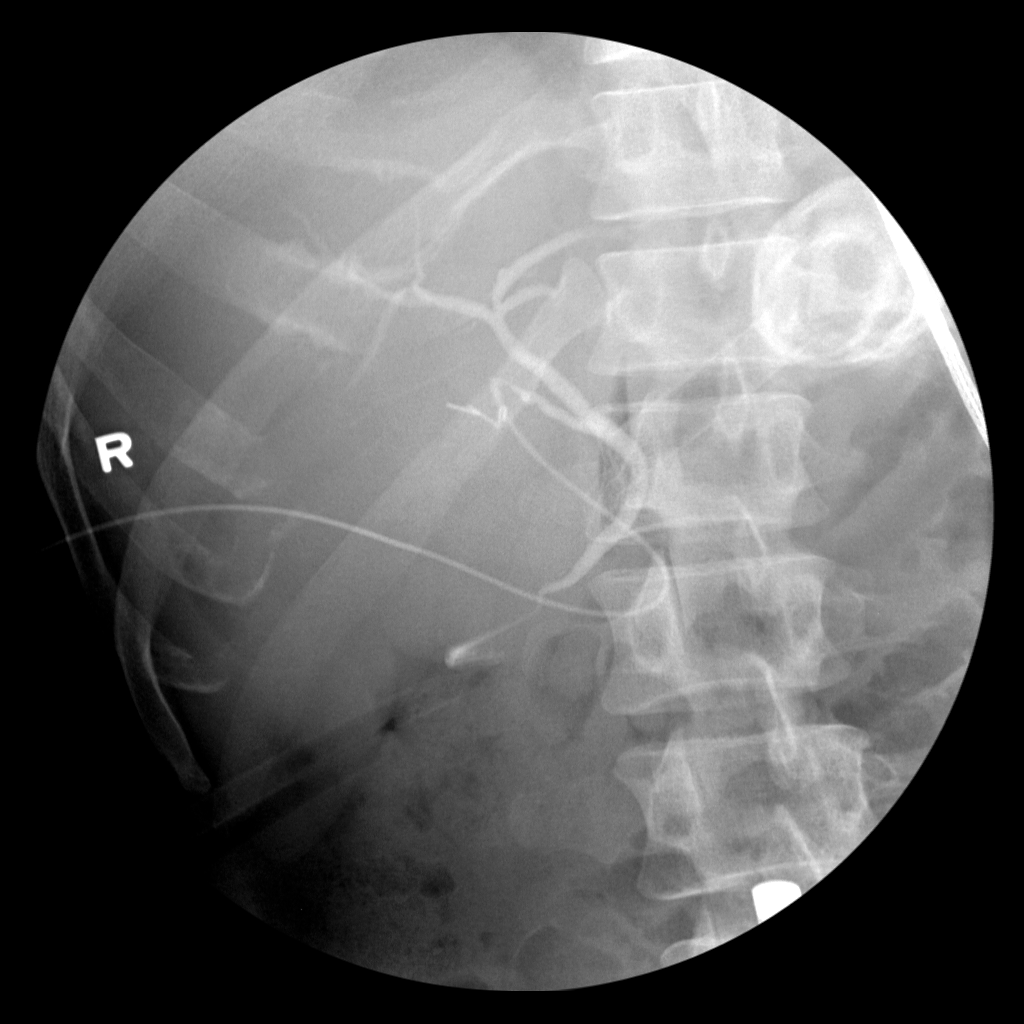
[im 1/2]
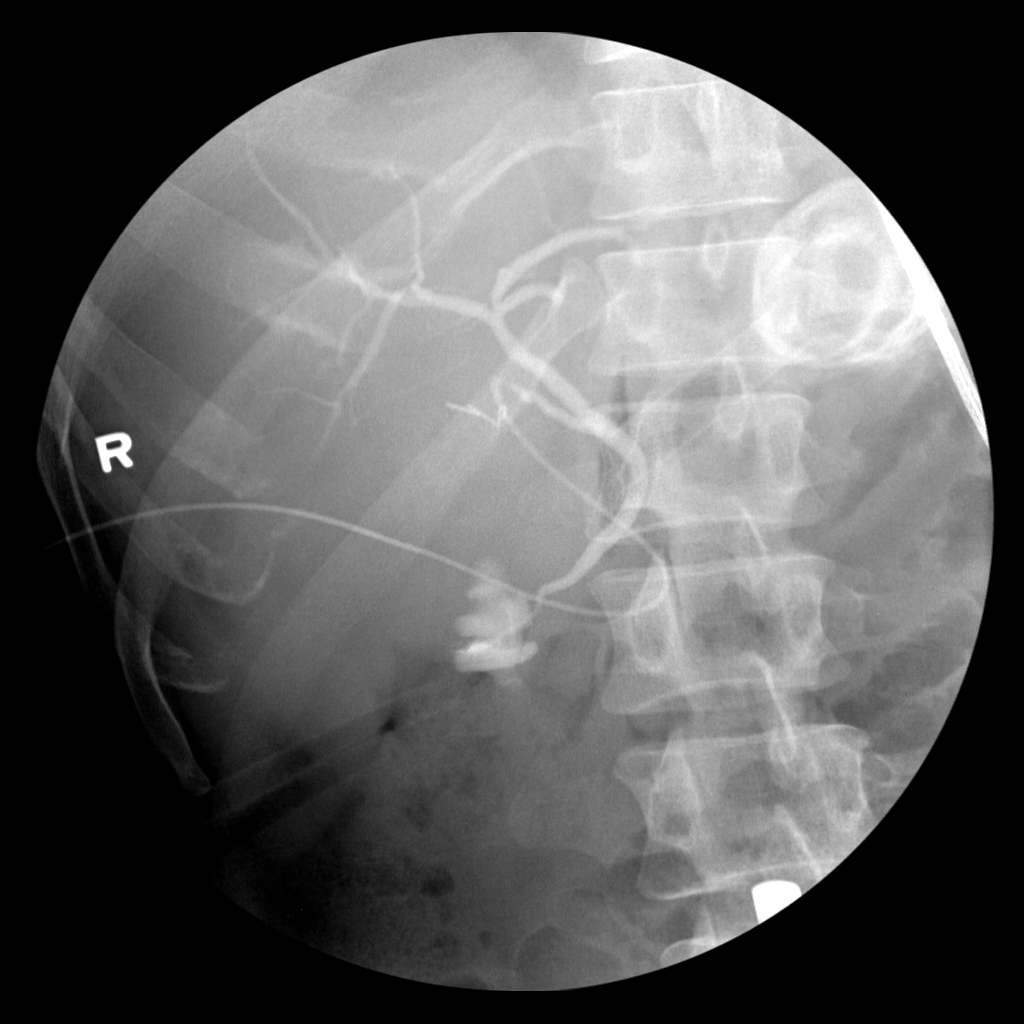
[im 2/2]
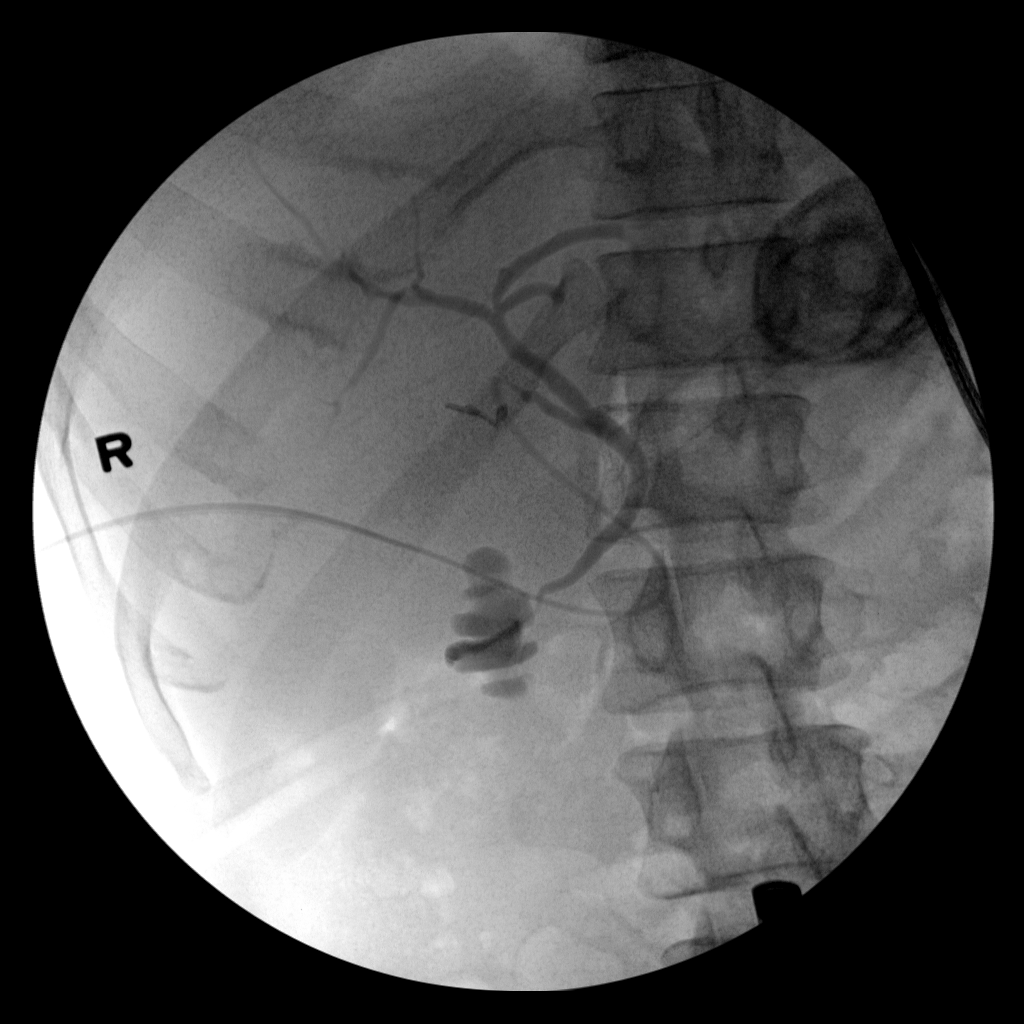

[5 of 5 positions shown; findings below may reference images not displayed]

FINDINGS: No persistent filling defects in the common duct. Intrahepatic ducts
are incompletely visualized, appearing decompressed centrally.
Contrast passes into the duodenum.

:
Negative for retained common duct stone.

## 2016-01-07 ENCOUNTER — Encounter (HOSPITAL_COMMUNITY): Payer: Self-pay | Admitting: *Deleted

## 2016-01-07 ENCOUNTER — Emergency Department (HOSPITAL_COMMUNITY)
Admission: EM | Admit: 2016-01-07 | Discharge: 2016-01-07 | Disposition: A | Discharge status: Home / Self Care | Payer: Self-pay | Attending: Emergency Medicine | Admitting: Emergency Medicine

## 2016-01-07 DIAGNOSIS — F17210 Nicotine dependence, cigarettes, uncomplicated: Secondary | ICD-10-CM | POA: Insufficient documentation

## 2016-01-07 DIAGNOSIS — R1013 Epigastric pain: Secondary | ICD-10-CM | POA: Insufficient documentation

## 2016-01-07 DIAGNOSIS — R1011 Right upper quadrant pain: Secondary | ICD-10-CM | POA: Insufficient documentation

## 2016-01-07 LAB — CBC WITH DIFFERENTIAL/PLATELET
Basophils Absolute: 0 10*3/uL (ref 0.0–0.1)
Basophils Relative: 0 %
EOS PCT: 0 %
Eosinophils Absolute: 0.1 10*3/uL (ref 0.0–0.7)
HCT: 46.5 % (ref 39.0–52.0)
Hemoglobin: 16.1 g/dL (ref 13.0–17.0)
LYMPHS ABS: 1.1 10*3/uL (ref 0.7–4.0)
LYMPHS PCT: 9 %
MCH: 32.3 pg (ref 26.0–34.0)
MCHC: 34.6 g/dL (ref 30.0–36.0)
MCV: 93.4 fL (ref 78.0–100.0)
MONO ABS: 0.6 10*3/uL (ref 0.1–1.0)
MONOS PCT: 5 %
Neutro Abs: 9.8 10*3/uL — ABNORMAL HIGH (ref 1.7–7.7)
Neutrophils Relative %: 86 %
PLATELETS: 244 10*3/uL (ref 150–400)
RBC: 4.98 MIL/uL (ref 4.22–5.81)
RDW: 13.1 % (ref 11.5–15.5)
WBC: 11.5 10*3/uL — ABNORMAL HIGH (ref 4.0–10.5)

## 2016-01-07 LAB — COMPREHENSIVE METABOLIC PANEL
ALT: 45 U/L (ref 17–63)
AST: 82 U/L — ABNORMAL HIGH (ref 15–41)
Albumin: 3.5 g/dL (ref 3.5–5.0)
Alkaline Phosphatase: 32 U/L — ABNORMAL LOW (ref 38–126)
Anion gap: 6 (ref 5–15)
BILIRUBIN TOTAL: 1.1 mg/dL (ref 0.3–1.2)
BUN: 13 mg/dL (ref 6–20)
CHLORIDE: 109 mmol/L (ref 101–111)
CO2: 22 mmol/L (ref 22–32)
CREATININE: 0.97 mg/dL (ref 0.61–1.24)
Calcium: 8.4 mg/dL — ABNORMAL LOW (ref 8.9–10.3)
Glucose, Bld: 118 mg/dL — ABNORMAL HIGH (ref 65–99)
POTASSIUM: 3.6 mmol/L (ref 3.5–5.1)
Sodium: 137 mmol/L (ref 135–145)
TOTAL PROTEIN: 5.4 g/dL — AB (ref 6.5–8.1)

## 2016-01-07 LAB — LIPASE, BLOOD: LIPASE: 21 U/L (ref 11–51)

## 2016-01-07 LAB — ETHANOL

## 2016-01-07 MED ORDER — GI COCKTAIL ~~LOC~~
30.0000 mL | Freq: Once | ORAL | Status: AC
  Administered 2016-01-07: 30 mL via ORAL
  Filled 2016-01-07: qty 30

## 2016-01-07 MED ORDER — MORPHINE SULFATE (PF) 4 MG/ML IV SOLN
4.0000 mg | Freq: Once | INTRAVENOUS | Status: DC
Start: 2016-01-07 — End: 2016-01-07

## 2016-01-07 MED ORDER — OMEPRAZOLE 20 MG PO CPDR: 20.0000 mg | DELAYED_RELEASE_CAPSULE | Freq: Every day | ORAL | Status: AC

## 2016-01-07 MED ORDER — MORPHINE SULFATE (PF) 4 MG/ML IV SOLN
4.0000 mg | Freq: Once | INTRAVENOUS | Status: AC
  Administered 2016-01-07: 4 mg via INTRAMUSCULAR
  Filled 2016-01-07: qty 1

## 2016-01-07 MED ORDER — SODIUM CHLORIDE 0.9 % IV SOLN: Freq: Once | INTRAVENOUS | Status: DC

## 2016-01-07 MED ORDER — ONDANSETRON 4 MG PO TBDP
8.0000 mg | ORAL_TABLET | Freq: Once | ORAL | Status: AC
  Administered 2016-01-07: 4 mg via ORAL
  Filled 2016-01-07: qty 2

## 2016-01-07 MED ORDER — ONDANSETRON HCL 4 MG/2ML IJ SOLN: 4.0000 mg | Freq: Once | INTRAMUSCULAR | Status: DC

## 2016-01-07 NOTE — ED Provider Notes (Signed)
CSN: [id number]     Arrival date & time 01/07/16  4098 History   First MD Initiated Contact with Patient 01/07/16 0757     Chief Complaint  Patient presents with  . Abdominal Pain     (Consider location/radiation/quality/duration/timing/severity/associated sxs/prior Treatment) The history is provided by the patient and medical records. No language interpreter was used.     Fred Mendez is a 40 y.o. male  with a hx of cholecystectomy presents to the Emergency Department complaining of gradual, persistent, progressively worsening RUQ abd pain described and cramping and waxing and waning onset 6:30am while [facility]. Associated symptoms include nausea without vomiting.  Nothing makes it better and nothing makes it worse.  Pt denies fever, chills, headache, neck pain, chest pain, SOB, vomiting, diarrhea, weakness, dizziness, syncope, dysuria, hematuria, melena, hematochezia, hematemesis.  Pt reports he drinks two 40oz beers per day.  Patient reports last alcohol intake was last night.   Past Medical History  Diagnosis Date  . Gallstones    Past Surgical History  Procedure Laterality Date  . Cholecystectomy N/A 03/19/2013    Procedure: LAPAROSCOPIC CHOLECYSTECTOMY WITH INTRAOPERATIVE CHOLANGIOGRAM;  Surgeon: Wilmon Arms. Corliss Skains, MD;  Location: [facility] OR;  Service: General;  Laterality: N/A;   History reviewed. No pertinent family history. Social History  Substance Use Topics  . Smoking status: Current Every Day Smoker -- 0.25 packs/day for 18 years    Types: Cigarettes  . Smokeless tobacco: Never Used  . Alcohol Use: Yes     Comment: "once in a blue moon"    Review of Systems  Constitutional: Negative for fever, diaphoresis, appetite change, fatigue and unexpected weight change.  HENT: Negative for mouth sores.   Eyes: Negative for visual disturbance.  Respiratory: Negative for cough, chest tightness, shortness of breath and wheezing.   Cardiovascular: Negative  for chest pain.  Gastrointestinal: Positive for nausea and abdominal pain. Negative for vomiting, diarrhea and constipation.  Endocrine: Negative for polydipsia, polyphagia and polyuria.  Genitourinary: Negative for dysuria, urgency, frequency and hematuria.  Musculoskeletal: Negative for back pain and neck stiffness.  Skin: Negative for rash.  Allergic/Immunologic: Negative for immunocompromised state.  Neurological: Negative for syncope, light-headedness and headaches.  Hematological: Does not bruise/bleed easily.  Psychiatric/Behavioral: Negative for sleep disturbance. The patient is not nervous/anxious.       Allergies  Review of patient's allergies indicates no known allergies.  Home Medications   Prior to Admission medications   Medication Sig Start Date End Date Taking? Authorizing Provider  naproxen (NAPROSYN) 500 MG tablet Take 1 tablet (500 mg total) by mouth 2 (two) times daily with a meal. Patient not taking: Reported on 01/07/2016 08/21/13   Cristobal Goldmann, PA-C  omeprazole (PRILOSEC) 20 MG capsule Take 1 capsule (20 mg total) by mouth daily. 01/07/16   Chanique Duca, PA-C   BP 142/81 mmHg  Pulse 67  Temp(Src) 97.5 F (36.4 C)  Resp 18  Ht  (1.702 m)  Wt 585.14 kg  BMI 202.00 kg/m2  SpO2 99% Physical Exam  Constitutional: He appears well-developed and well-nourished. No distress.  Awake, alert, nontoxic appearance  HENT:  Head: Normocephalic and atraumatic.  Mouth/Throat: Oropharynx is clear and moist. No oropharyngeal exudate.  Eyes: Conjunctivae are normal. No scleral icterus.  Neck: Normal range of motion. Neck supple.  Cardiovascular: Normal rate, regular rhythm and intact distal pulses.   Pulmonary/Chest: Effort normal and breath sounds normal. No respiratory distress. He has no wheezes.  Equal chest  expansion  Abdominal: Soft. Bowel sounds are normal. He exhibits no distension and no mass. There is tenderness in the right upper quadrant and  epigastric area. There is no rebound, no guarding and no CVA tenderness.  Mild right upper quadrant and epigastric abdominal tenderness without guarding or rebound No CVA tenderness  Musculoskeletal: Normal range of motion. He exhibits no edema.  Neurological: He is alert.  Speech is clear and goal oriented Moves extremities without ataxia  Skin: Skin is warm and dry. He is not diaphoretic.  Psychiatric: He has a normal mood and affect.  Nursing note and vitals reviewed.   ED Course  Procedures (including critical care time) Labs Review Labs Reviewed  CBC WITH DIFFERENTIAL/PLATELET - Abnormal; Notable for the following:    WBC 11.5 (*)    Neutro Abs 9.8 (*)    All other components within normal limits  COMPREHENSIVE METABOLIC PANEL - Abnormal; Notable for the following:    Glucose, Bld 118 (*)    Calcium 8.4 (*)    Total Protein 5.4 (*)    AST 82 (*)    Alkaline Phosphatase 32 (*)    All other components within normal limits  LIPASE, BLOOD  ETHANOL    MDM   Final diagnoses:  Right upper quadrant pain   Fred Mendez presents with epigastric and RUQ abd pain.  Reports of tenderness on exam, but pt does not appear uncomfortable.  Abd is soft without rebound or guarding.  Pt is without CP or SOB.  No emesis.    9:36 AM Repeat exam shows abd remains soft without guarding or rebound.  Tenderness has decreased.  Pt is well appearing without fever, tachycardia or hypotension.  No evidence of sepsis. Pt is s/p cholecystectomy.  Labs are reassuring. Mild leukocytosis of 11.5. Slight elevation in AST, normal ALT consistent with alcoholic pattern. Normal lipase.    BP 135/75 mmHg  Pulse 59  Temp(Src) 97.9 F (36.6 C) (Oral)  Resp 18  Ht 5\' 7"  (1.702 m)  Wt 585.14 kg  BMI 202.00 kg/m2  SpO2 96%  10:08 AM Pt is feeling better, tolerating PO without difficulty.  Abd remains nontender on repeat exam.  Pt's pain is likely alcoholic gastritis vs PUD.  Will begin on omeprazole.   Recommend .  Doubt acute hepatitis, pancreatitis, diverticulitis, bowel perforation.    Fred ClientHannah Chelsey Kimberley, PA-C 01/07/16 1019  Lyndal Pulleyaniel Knott, MD 01/07/16 [phone]

## 2016-01-07 NOTE — ED Notes (Signed)
C/o abd. Pain onset this am 6am states he was [facility]. Denies n/v

## 2016-01-07 NOTE — ED Notes (Signed)
States his pain is getting better

## 2016-01-07 NOTE — Discharge Instructions (Signed)
1. Medications: omeprazole, usual home medications 2. Treatment: rest, drink plenty of fluids, decrease your drinking 3. Follow Up: Please followup with your primary doctor in 2 days for discussion of your diagnoses and further evaluation after today's visit; if you do not have a primary care doctor use the resource guide provided to find one; Please return to the ER for fever, worsening pain, persistent vomiting or other concerns    Abdominal Pain, Adult Many things can cause abdominal pain. Usually, abdominal pain is not caused by a disease and will improve without treatment. It can often be observed and treated at home. Your health care provider will do a physical exam and possibly order blood tests and X-rays to help determine the seriousness of your pain. However, in many cases, more time must pass before a clear cause of the pain can be found. Before that point, your health care provider may not know if you need more testing or further treatment. HOME CARE INSTRUCTIONS Monitor your abdominal pain for any changes. The following actions may help to alleviate any discomfort you are experiencing:  Only take over-the-counter or prescription medicines as directed by your health care provider.  Do not take laxatives unless directed to do so by your health care provider.  Try a clear liquid diet (broth, tea, or water) as directed by your health care provider. Slowly move to a bland diet as tolerated. SEEK MEDICAL CARE IF:  You have unexplained abdominal pain.  You have abdominal pain associated with nausea or diarrhea.  You have pain when you urinate or have a bowel movement.  You experience abdominal pain that wakes you in the night.  You have abdominal pain that is worsened or improved by eating food.  You have abdominal pain that is worsened with eating fatty foods.  You have a fever. SEEK IMMEDIATE MEDICAL CARE IF:  Your pain does not go away within 2 hours.  You keep throwing up  (vomiting).  Your pain is felt only in portions of the abdomen, such as the right side or the left lower portion of the abdomen.  You pass bloody or black tarry stools. MAKE SURE YOU:  Understand these instructions.  Will watch your condition.  Will get help right away if you are not doing well or get worse.   This information is not intended to replace advice given to you by your health care provider. Make sure you discuss any questions you have with your health care provider.   Document Released: 03/16/2005 Document Revised: 02/25/2015 Document Reviewed: 02/13/2013 [employer] Interactive Patient Education [employer]2016 [employer]
